# Patient Record
Sex: Female | Born: 1950
Health system: Southern US, Community
[De-identification: ages and names within clinical notes are randomized; demographics above are authoritative.]

## PROBLEM LIST (undated history)

## (undated) DIAGNOSIS — I10 Essential (primary) hypertension: Secondary | ICD-10-CM

## (undated) DIAGNOSIS — M1990 Unspecified osteoarthritis, unspecified site: Secondary | ICD-10-CM

## (undated) DIAGNOSIS — K2270 Barrett's esophagus without dysplasia: Secondary | ICD-10-CM

## (undated) HISTORY — DX: Barrett's esophagus without dysplasia: K22.70

## (undated) HISTORY — PX: HAND SURGERY: SHX662

## (undated) HISTORY — PX: FOOT SURGERY: SHX648

## (undated) HISTORY — DX: Unspecified osteoarthritis, unspecified site: M19.90

## (undated) HISTORY — DX: Essential (primary) hypertension: I10

## (undated) HISTORY — PX: ABDOMINAL HYSTERECTOMY: SHX81

---

## 2004-07-11 ENCOUNTER — Ambulatory Visit: Payer: Self-pay | Admitting: Family Medicine

## 2004-08-29 ENCOUNTER — Other Ambulatory Visit: Payer: Self-pay

## 2004-09-06 ENCOUNTER — Ambulatory Visit: Payer: Self-pay | Admitting: Specialist

## 2004-10-09 ENCOUNTER — Encounter: Payer: Self-pay | Admitting: Specialist

## 2004-10-25 ENCOUNTER — Encounter: Payer: Self-pay | Admitting: Specialist

## 2004-11-22 ENCOUNTER — Encounter: Payer: Self-pay | Admitting: Specialist

## 2005-10-08 ENCOUNTER — Ambulatory Visit: Payer: Self-pay | Admitting: Family Medicine

## 2006-09-17 ENCOUNTER — Emergency Department: Payer: Self-pay | Admitting: Internal Medicine

## 2006-11-08 ENCOUNTER — Ambulatory Visit: Payer: Self-pay | Admitting: Family Medicine

## 2007-11-11 ENCOUNTER — Ambulatory Visit: Payer: Self-pay | Admitting: Family Medicine

## 2007-11-23 ENCOUNTER — Ambulatory Visit: Payer: Self-pay | Admitting: Oncology

## 2007-12-08 ENCOUNTER — Ambulatory Visit: Payer: Self-pay | Admitting: Oncology

## 2007-12-24 ENCOUNTER — Ambulatory Visit: Payer: Self-pay | Admitting: Oncology

## 2008-11-17 ENCOUNTER — Ambulatory Visit: Payer: Self-pay | Admitting: Family Medicine

## 2009-06-22 ENCOUNTER — Ambulatory Visit: Payer: Self-pay | Admitting: Gastroenterology

## 2009-11-22 ENCOUNTER — Ambulatory Visit: Payer: Self-pay | Admitting: Family Medicine

## 2010-11-24 ENCOUNTER — Ambulatory Visit: Payer: Self-pay | Admitting: Family Medicine

## 2011-12-17 ENCOUNTER — Ambulatory Visit: Payer: Self-pay | Admitting: Family Medicine

## 2012-04-22 DIAGNOSIS — M549 Dorsalgia, unspecified: Secondary | ICD-10-CM | POA: Insufficient documentation

## 2012-04-22 DIAGNOSIS — I10 Essential (primary) hypertension: Secondary | ICD-10-CM | POA: Insufficient documentation

## 2012-04-22 DIAGNOSIS — E785 Hyperlipidemia, unspecified: Secondary | ICD-10-CM | POA: Insufficient documentation

## 2012-04-22 DIAGNOSIS — K635 Polyp of colon: Secondary | ICD-10-CM | POA: Insufficient documentation

## 2012-04-22 DIAGNOSIS — Z803 Family history of malignant neoplasm of breast: Secondary | ICD-10-CM | POA: Insufficient documentation

## 2012-10-23 DIAGNOSIS — Z79891 Long term (current) use of opiate analgesic: Secondary | ICD-10-CM | POA: Insufficient documentation

## 2012-10-23 DIAGNOSIS — Z79899 Other long term (current) drug therapy: Secondary | ICD-10-CM | POA: Insufficient documentation

## 2012-12-17 ENCOUNTER — Ambulatory Visit: Payer: Self-pay | Admitting: Family Medicine

## 2013-04-13 DIAGNOSIS — M545 Low back pain, unspecified: Secondary | ICD-10-CM | POA: Insufficient documentation

## 2013-04-13 DIAGNOSIS — M25559 Pain in unspecified hip: Secondary | ICD-10-CM | POA: Insufficient documentation

## 2013-04-13 DIAGNOSIS — M25551 Pain in right hip: Secondary | ICD-10-CM | POA: Insufficient documentation

## 2014-01-21 ENCOUNTER — Ambulatory Visit: Payer: Self-pay | Admitting: Family Medicine

## 2014-03-11 ENCOUNTER — Ambulatory Visit: Payer: Self-pay | Admitting: Gastroenterology

## 2014-07-19 ENCOUNTER — Ambulatory Visit: Payer: Self-pay | Admitting: Podiatry

## 2014-07-21 ENCOUNTER — Encounter: Payer: Self-pay | Admitting: *Deleted

## 2014-07-21 ENCOUNTER — Other Ambulatory Visit: Payer: Self-pay | Admitting: *Deleted

## 2014-07-21 ENCOUNTER — Ambulatory Visit (INDEPENDENT_AMBULATORY_CARE_PROVIDER_SITE_OTHER): Payer: Federal, State, Local not specified - PPO | Admitting: Podiatry

## 2014-07-21 ENCOUNTER — Encounter: Payer: Self-pay | Admitting: Podiatry

## 2014-07-21 ENCOUNTER — Ambulatory Visit (INDEPENDENT_AMBULATORY_CARE_PROVIDER_SITE_OTHER): Payer: Federal, State, Local not specified - PPO

## 2014-07-21 VITALS — BP 110/77 | HR 70 | Resp 16 | Ht 66.0 in | Wt 225.0 lb

## 2014-07-21 DIAGNOSIS — S90121A Contusion of right lesser toe(s) without damage to nail, initial encounter: Secondary | ICD-10-CM

## 2014-07-21 DIAGNOSIS — S93602A Unspecified sprain of left foot, initial encounter: Secondary | ICD-10-CM

## 2014-07-21 DIAGNOSIS — S92912A Unspecified fracture of left toe(s), initial encounter for closed fracture: Secondary | ICD-10-CM

## 2014-07-21 NOTE — Progress Notes (Signed)
She presents today stating that she has recently fallen off her porch twisting her left foot and ankle and injuring her right toe as she points the second digit of the right foot. She denies any changes in her past medical history medications allergies social history family history.  Objective: Vital signs are stable she is alert and oriented 3. Pulses are palpable bilateral. Swollen with ecchymosis overlying the dorsal lateral aspect of the fifth metatarsal left. Right digit second is swollen and mildly erythematous. Radiographic evaluation demonstrates complete transverse fracture of the fifth metatarsal of the left foot. This is consistent with mid diaphyseal distal comminuted fracture. A nondisplaced lateral fracture of the proximal phalanx second digit of the right foot is present.  Assessment: Fracture fifth metatarsal left. Fracture proximal phalanx second digit right foot.  Plan: At this point due to its displacement and the comminution I feel it is important to perform an open reduction with internal fixation requiring screws and plates to the fifth metatarsal left foot. I answered all of her questions regarding a consent form today stating that an open reduction and internal fixation will be performed to the fifth metatarsal left foot. I answered those questions to the best of my ability in layman's terms. She understood it was amenable to it and signed all 3 pages of consent form we discussed the pros and cons of the surgery and the fact that if surgery were not performed that she could ultimately have a chronically painful foot. She also understands that there are possible postop complications which may include but are not limited to postoperative pain bleeding swelling infection recurrence need for further surgery overcorrection and under correction and failure to heal. She also understands that she will need a below-the-knee cast for her postop period of 8 weeks. We'll follow up with her  Friday.

## 2014-07-22 ENCOUNTER — Other Ambulatory Visit: Payer: Self-pay | Admitting: Podiatry

## 2014-07-22 MED ORDER — CLINDAMYCIN HCL 150 MG PO CAPS: 150.0000 mg | ORAL_CAPSULE | Freq: Three times a day (TID) | ORAL | Status: DC

## 2014-07-22 MED ORDER — PROMETHAZINE HCL 25 MG PO TABS: 25.0000 mg | ORAL_TABLET | Freq: Three times a day (TID) | ORAL | Status: DC | PRN

## 2014-07-22 MED ORDER — OXYCODONE-ACETAMINOPHEN 10-325 MG PO TABS: 1.0000 | ORAL_TABLET | Freq: Four times a day (QID) | ORAL | Status: DC | PRN

## 2014-07-23 ENCOUNTER — Encounter: Payer: Self-pay | Admitting: Podiatry

## 2014-07-23 DIAGNOSIS — S92302D Fracture of unspecified metatarsal bone(s), left foot, subsequent encounter for fracture with routine healing: Secondary | ICD-10-CM

## 2014-07-23 NOTE — Progress Notes (Signed)
1. Open reduction internal fixation 5th left  2. Cast app

## 2014-07-24 ENCOUNTER — Telehealth: Payer: Self-pay

## 2014-07-24 NOTE — Telephone Encounter (Signed)
  Left message for patient to call with questions or concerns regarding post operative status 

## 2014-07-27 ENCOUNTER — Other Ambulatory Visit: Payer: Self-pay | Admitting: Podiatry

## 2014-07-27 ENCOUNTER — Telehealth: Payer: Self-pay | Admitting: *Deleted

## 2014-07-27 MED ORDER — HYDROMORPHONE HCL 2 MG PO TABS: 2.0000 mg | ORAL_TABLET | Freq: Four times a day (QID) | ORAL | Status: DC | PRN

## 2014-07-27 NOTE — Telephone Encounter (Signed)
If it is only itching and there are no other allergy symptoms, see if she can take benadryl. She probably has phenergan as well which can help with the itching. If she wants a different medication, she can try vicodin. Although the same class of medication, may not cause itching.

## 2014-07-27 NOTE — Telephone Encounter (Signed)
Having a reaction to one of the medications, its making her itch. Took percocet before bed last night and had itching , took antibiotic this morning and not itching. Can something else be prescribed?

## 2014-07-27 NOTE — Telephone Encounter (Signed)
Can you do this

## 2014-07-28 NOTE — Telephone Encounter (Signed)
May write her for vicodin.  First see if she is doing better.  Does she still need the medication for pain? Might just want to consider anti-inflammatories.

## 2014-08-01 NOTE — Progress Notes (Signed)
Dr Milinda Pointer performed an ORIF 5th met left foot with plate and screw fixation and cast application on 79/49/97

## 2014-08-02 ENCOUNTER — Ambulatory Visit (INDEPENDENT_AMBULATORY_CARE_PROVIDER_SITE_OTHER): Payer: Federal, State, Local not specified - PPO

## 2014-08-02 ENCOUNTER — Ambulatory Visit (INDEPENDENT_AMBULATORY_CARE_PROVIDER_SITE_OTHER): Payer: Federal, State, Local not specified - PPO | Admitting: Podiatry

## 2014-08-02 VITALS — BP 142/69 | HR 78 | Temp 97.9°F | Resp 16

## 2014-08-02 DIAGNOSIS — Z9889 Other specified postprocedural states: Secondary | ICD-10-CM

## 2014-08-02 DIAGNOSIS — S92302D Fracture of unspecified metatarsal bone(s), left foot, subsequent encounter for fracture with routine healing: Secondary | ICD-10-CM

## 2014-08-02 NOTE — Progress Notes (Signed)
She presents today a little more than 1 week status post open reduction internal fixation fifth metatarsal left foot. She denies fever chills nausea vomiting muscle aches and pains.  Objective: Vital signs are stable she is alert and oriented 3. Cast is intact but the bottom of the cast is dirty. Cast is loose around the calf but maintains integrity around the foot. Radiographic evaluation demonstrates fifth metatarsal in good alignment.  Assessment: Well-healing surgical foot left.  Plan: Remove cast next visit. Recast next visit remove staples next visit. Remain nonweightbearing.

## 2014-08-09 ENCOUNTER — Ambulatory Visit (INDEPENDENT_AMBULATORY_CARE_PROVIDER_SITE_OTHER): Payer: Federal, State, Local not specified - PPO | Admitting: Podiatry

## 2014-08-09 VITALS — BP 135/69 | HR 62 | Temp 97.7°F | Resp 16

## 2014-08-09 DIAGNOSIS — S92302D Fracture of unspecified metatarsal bone(s), left foot, subsequent encounter for fracture with routine healing: Secondary | ICD-10-CM

## 2014-08-09 DIAGNOSIS — Z9889 Other specified postprocedural states: Secondary | ICD-10-CM

## 2014-08-09 MED ORDER — OXYCODONE-ACETAMINOPHEN 10-325 MG PO TABS: 1.0000 | ORAL_TABLET | Freq: Four times a day (QID) | ORAL | Status: DC | PRN

## 2014-08-09 NOTE — Progress Notes (Signed)
2 weeks status post open reduction internal fixation left foot. Denies fever chills nausea vomiting muscle aches and pains. Continues to remain nonweightbearing.  Objective: Vital signs are stable alert and oriented 3 cast intact was removed reveals margins are well coapted staples are intact. Removed approximately half of the staples today margins remain well coapted. That she does have considerable amount of brawny edema left foot.  Assessment: Two-week status post open reduction internal fixation left foot.  Plan: Remove cast left foot and leg today. She will remain nonweightbearing with her Cam Walker.

## 2014-08-25 ENCOUNTER — Ambulatory Visit (INDEPENDENT_AMBULATORY_CARE_PROVIDER_SITE_OTHER): Payer: Federal, State, Local not specified - PPO

## 2014-08-25 ENCOUNTER — Ambulatory Visit (INDEPENDENT_AMBULATORY_CARE_PROVIDER_SITE_OTHER): Payer: Federal, State, Local not specified - PPO | Admitting: Podiatry

## 2014-08-25 VITALS — BP 112/70 | HR 66 | Resp 16

## 2014-08-25 DIAGNOSIS — Z9889 Other specified postprocedural states: Secondary | ICD-10-CM

## 2014-08-25 DIAGNOSIS — S92302D Fracture of unspecified metatarsal bone(s), left foot, subsequent encounter for fracture with routine healing: Secondary | ICD-10-CM

## 2014-08-26 NOTE — Progress Notes (Signed)
She presents today utilizing crutches for follow-up of her fifth metatarsal open reduction internal fixation date of surgery was 07/23/2014. She states this seems to be doing pretty well.  Objective: Presenting with crutches and a partial weight-bearing stance utilizing her cam walker. Once removed reveals a superficial dehiscence only measuring approximately a centimeter in length and 4 mm in width with fibrous deposition. No signs of infection the sutures that were remaining were removed and the margins remained well coapted in that area. I resected the fibrous deposition today and encouraged bleeding of the granular tissue. I saw no signs of infection. No erythema no cellulitis drainage or odor. Radiographic evaluation demonstrates well-healing fifth metatarsal with open reduction internal fixation utilizing a plate and 6 screws.  Assessment: Well-healing fifth metatarsal fracture right.  Plan: She will continue to utilize her Cam Walker. She will perform wet-to-dry dressing changes twice daily to the wound. She does not have to utilize her crutches. She was given strict instructions as not to walk on this barefoot.

## 2014-09-06 ENCOUNTER — Ambulatory Visit (INDEPENDENT_AMBULATORY_CARE_PROVIDER_SITE_OTHER): Payer: Federal, State, Local not specified - PPO

## 2014-09-06 ENCOUNTER — Other Ambulatory Visit: Payer: Self-pay | Admitting: Podiatry

## 2014-09-06 ENCOUNTER — Ambulatory Visit (INDEPENDENT_AMBULATORY_CARE_PROVIDER_SITE_OTHER): Payer: Federal, State, Local not specified - PPO | Admitting: Podiatry

## 2014-09-06 ENCOUNTER — Encounter: Payer: Self-pay | Admitting: Podiatry

## 2014-09-06 VITALS — BP 136/69 | HR 79 | Resp 16

## 2014-09-06 DIAGNOSIS — S92301D Fracture of unspecified metatarsal bone(s), right foot, subsequent encounter for fracture with routine healing: Secondary | ICD-10-CM

## 2014-09-06 DIAGNOSIS — S92302D Fracture of unspecified metatarsal bone(s), left foot, subsequent encounter for fracture with routine healing: Secondary | ICD-10-CM

## 2014-09-06 DIAGNOSIS — M79673 Pain in unspecified foot: Secondary | ICD-10-CM

## 2014-09-06 NOTE — Progress Notes (Signed)
She presents today for follow-up of open reduction internal fixation fifth metatarsal left foot. She states that it seems to be doing pretty well. She presents today in her Darco shoe.  Objective: Vital signs are stable she is alert and oriented 3 date of surgery was 07/23/2014 open reduction internal fixation 6-hole plate fifth metatarsal left foot. Radiographically appears to be healing well physical exam demonstrate mild edema overlying the fifth met.  Assessment well-healing surgical foot left.  Plan:

## 2014-10-04 ENCOUNTER — Ambulatory Visit (INDEPENDENT_AMBULATORY_CARE_PROVIDER_SITE_OTHER): Payer: Federal, State, Local not specified - PPO

## 2014-10-04 ENCOUNTER — Ambulatory Visit (INDEPENDENT_AMBULATORY_CARE_PROVIDER_SITE_OTHER): Payer: Federal, State, Local not specified - PPO | Admitting: Podiatry

## 2014-10-04 VITALS — BP 140/70 | HR 84 | Resp 16

## 2014-10-04 DIAGNOSIS — S92302D Fracture of unspecified metatarsal bone(s), left foot, subsequent encounter for fracture with routine healing: Secondary | ICD-10-CM

## 2014-10-04 NOTE — Progress Notes (Signed)
She presents today for follow-up of open reduction internal fixation fifth metatarsal left foot. She states it seems to be doing pretty well.  Objective: Vital signs are stable she is alert and oriented 3. Pulses are palpable. No pain on palpation fifth metatarsal base left foot. No edema. Radiographic evaluation demonstrates well-healed fifth metatarsal fracture fifth left.  Assessment: Well-healing surgical foot left.  Plan: Follow up with me as needed.

## 2014-10-27 ENCOUNTER — Ambulatory Visit: Payer: Self-pay | Admitting: Family Medicine

## 2014-12-16 ENCOUNTER — Emergency Department: Payer: Self-pay | Admitting: Emergency Medicine

## 2015-01-07 ENCOUNTER — Other Ambulatory Visit: Payer: Self-pay | Admitting: Family Medicine

## 2015-01-07 DIAGNOSIS — M479 Spondylosis, unspecified: Secondary | ICD-10-CM

## 2015-01-07 DIAGNOSIS — Z1231 Encounter for screening mammogram for malignant neoplasm of breast: Secondary | ICD-10-CM

## 2015-01-24 ENCOUNTER — Ambulatory Visit
Admission: RE | Admit: 2015-01-24 | Discharge: 2015-01-24 | Disposition: A | Discharge status: Home / Self Care | Payer: Federal, State, Local not specified - PPO | Source: Ambulatory Visit | Attending: Family Medicine | Admitting: Family Medicine

## 2015-01-24 DIAGNOSIS — Z1231 Encounter for screening mammogram for malignant neoplasm of breast: Secondary | ICD-10-CM | POA: Diagnosis present

## 2015-02-11 ENCOUNTER — Telehealth: Payer: Self-pay | Admitting: *Deleted

## 2015-02-11 NOTE — Telephone Encounter (Signed)
Received a fax requesting medical necessity for DME. Faxed chart notes to [facility] [place] at [phone]

## 2015-03-10 ENCOUNTER — Encounter: Payer: Self-pay | Admitting: *Deleted

## 2015-04-12 ENCOUNTER — Encounter: Payer: Self-pay | Admitting: Family Medicine

## 2015-04-12 ENCOUNTER — Ambulatory Visit (INDEPENDENT_AMBULATORY_CARE_PROVIDER_SITE_OTHER): Payer: Federal, State, Local not specified - PPO | Admitting: Family Medicine

## 2015-04-12 VITALS — BP 130/70 | HR 64 | Temp 98.0°F | Resp 16 | Ht 63.0 in | Wt 219.4 lb

## 2015-04-12 DIAGNOSIS — E785 Hyperlipidemia, unspecified: Secondary | ICD-10-CM | POA: Diagnosis not present

## 2015-04-12 DIAGNOSIS — I10 Essential (primary) hypertension: Secondary | ICD-10-CM

## 2015-04-12 DIAGNOSIS — K589 Irritable bowel syndrome without diarrhea: Secondary | ICD-10-CM

## 2015-04-12 DIAGNOSIS — K2970 Gastritis, unspecified, without bleeding: Secondary | ICD-10-CM

## 2015-04-12 NOTE — Progress Notes (Signed)
Name: Monica Chapman   MRN: [id number]    DOB: 1950/12/12   Date:04/12/2015       Progress Note  Subjective  Chief Complaint  Chief Complaint  Patient presents with  . Hypertension    Hypertension Pertinent negatives include no blurred vision, chest pain, headaches, malaise/fatigue, orthopnea, palpitations or shortness of breath.    Here for f/u of HBP.  C/o GI sx of bloating, frequent BMs, loose stools.  No other problems other than that.   Taking Wellbutrin to help stop smoking. But she is still smoking daily, but at a reduced amount.   Past Medical History  Diagnosis Date  . Arthritis   . Hypertension     Past Surgical History  Procedure Laterality Date  . Foot surgery    . Abdominal hysterectomy      Family History  Problem Relation Age of Onset  . Breast cancer Mother 60  . Breast cancer Maternal Grandmother     History   Social History  . Marital Status: Married    Spouse Name: N/A  . Number of Children: N/A  . Years of Education: N/A   Occupational History  . Not on file.   Social History Main Topics  . Smoking status: Current Every Day Smoker -- 0.25 packs/day    Types: Cigarettes  . Smokeless tobacco: Never Used  . Alcohol Use: No  . Drug Use: No  . Sexual Activity: Not on file   Other Topics Concern  . Not on file   Social History Narrative     Current outpatient prescriptions:  .  alendronate (FOSAMAX) 70 MG tablet, , Disp: , Rfl: 1 .  atorvastatin (LIPITOR) 20 MG tablet, 10 mg. , Disp: , Rfl:  .  buPROPion (WELLBUTRIN XL) 150 MG 24 hr tablet, , Disp: , Rfl: 0 .  calcium-vitamin D (OSCAL WITH D) 500-200 MG-UNIT per tablet, Take 1 tablet by mouth., Disp: , Rfl:  .  Cetirizine HCl 10 MG CAPS, Take by mouth., Disp: , Rfl:  .  hydrochlorothiazide (HYDRODIURIL) 12.5 MG tablet, Take 12.5 mg by mouth daily., Disp: , Rfl:  .  losartan (COZAAR) 100 MG tablet, Take 100 mg by mouth daily., Disp: , Rfl: 0 .  Melatonin 3 MG TABS, Take by mouth as  needed., Disp: , Rfl:  .  omeprazole (PRILOSEC) 20 MG capsule, , Disp: , Rfl: 0 .  oxyCODONE-acetaminophen (PERCOCET) 10-325 MG per tablet, Take 1 tablet by mouth every 6 (six) hours as needed for pain., Disp: 40 tablet, Rfl: 0  Allergies  Allergen Reactions  . Penicillins Itching    At about 64 yo with itching not serious. Did not require medical care     Review of Systems  Constitutional: Negative.  Negative for fever, chills, weight loss and malaise/fatigue.  HENT: Negative.   Eyes: Negative.  Negative for blurred vision and double vision.  Respiratory: Negative.  Negative for cough, sputum production, shortness of breath and wheezing.   Cardiovascular: Negative.  Negative for chest pain, palpitations, orthopnea and leg swelling.  Gastrointestinal: Positive for heartburn, abdominal pain and diarrhea. Negative for nausea, vomiting and blood in stool.  Genitourinary: Negative.  Negative for dysuria, urgency and frequency.  Musculoskeletal: Positive for back pain. Negative for myalgias and joint pain.  Skin: Negative.  Negative for rash.  Neurological: Negative for dizziness, tingling, weakness and headaches.  Psychiatric/Behavioral: Positive for depression. The patient is not nervous/anxious.       Objective  Filed Vitals:  04/12/15 0945  BP: 110/64  Pulse: 64  Temp: 98 F (36.7 C)  Resp: 16  Height: 5\' 3"  (1.6 m)  Weight: 219 lb 6.4 oz (99.519 kg)    Physical Exam  Constitutional: She is well-developed, well-nourished, and in no distress.  HENT:  Head: Normocephalic and atraumatic.  Eyes: Conjunctivae and EOM are normal. Pupils are equal, round, and reactive to light. No scleral icterus.  Neck: Normal range of motion. Neck supple. No thyromegaly present.  Cardiovascular: Normal rate, regular rhythm and intact distal pulses.  Exam reveals no gallop and no friction rub.   Murmur heard.  Systolic murmur is present with a grade of 2/6  URSB  Pulmonary/Chest: Effort  normal and breath sounds normal. No respiratory distress. She has no wheezes. She has no rales.  Abdominal: Soft. Bowel sounds are normal. She exhibits mass. There is tenderness (minimal tenderness in epigastrium).  Musculoskeletal: She exhibits edema (trace bilateral pedal edema.).  Lymphadenopathy:    She has no cervical adenopathy.  Psychiatric: Mood, memory, affect and judgment normal.  Vitals reviewed.         Assessment & Plan  Problem List Items Addressed This Visit    None      Meds ordered this encounter  Medications  . alendronate (FOSAMAX) 70 MG tablet    Sig:     Refill:  1  . buPROPion (WELLBUTRIN XL) 150 MG 24 hr tablet    Sig:     Refill:  0  . losartan (COZAAR) 100 MG tablet    Sig: Take 100 mg by mouth daily.    Refill:  0  . omeprazole (PRILOSEC) 20 MG capsule    Sig:     Refill:  0  . calcium-vitamin D (OSCAL WITH D) 500-200 MG-UNIT per tablet    Sig: Take 1 tablet by mouth.  . hydrochlorothiazide (HYDRODIURIL) 12.5 MG tablet    Sig: Take 12.5 mg by mouth daily.  . Cetirizine HCl 10 MG CAPS    Sig: Take by mouth.  . Melatonin 3 MG TABS    Sig: Take by mouth as needed.    1. Essential hypertension -continue meds.  2. HLD (hyperlipidemia) -continue meds  3. Gastritis -continue meds  4. IBS (irritable bowel syndrome)

## 2015-04-12 NOTE — Patient Instructions (Signed)
Increase water and fiber (Metamucil, Fibercon, etc) in diet.

## 2015-05-18 ENCOUNTER — Other Ambulatory Visit: Payer: Self-pay | Admitting: Family Medicine

## 2015-05-18 NOTE — Telephone Encounter (Signed)
Pt need a refill on   Hydrochlorothiazide  125 mg

## 2015-05-18 NOTE — Telephone Encounter (Signed)
Called pharmacy and patient has refill on file.

## 2015-07-14 ENCOUNTER — Ambulatory Visit (INDEPENDENT_AMBULATORY_CARE_PROVIDER_SITE_OTHER): Payer: Federal, State, Local not specified - PPO | Admitting: Family Medicine

## 2015-07-14 ENCOUNTER — Encounter: Payer: Self-pay | Admitting: Family Medicine

## 2015-07-14 VITALS — BP 119/71 | HR 61 | Temp 98.3°F | Resp 16 | Ht 63.0 in | Wt 222.2 lb

## 2015-07-14 DIAGNOSIS — G5691 Unspecified mononeuropathy of right upper limb: Secondary | ICD-10-CM

## 2015-07-14 DIAGNOSIS — I10 Essential (primary) hypertension: Secondary | ICD-10-CM

## 2015-07-14 DIAGNOSIS — K2970 Gastritis, unspecified, without bleeding: Secondary | ICD-10-CM

## 2015-07-14 DIAGNOSIS — G5690 Unspecified mononeuropathy of unspecified upper limb: Secondary | ICD-10-CM | POA: Insufficient documentation

## 2015-07-14 NOTE — Progress Notes (Signed)
Name: Monica Chapman   MRN: [id number]    DOB: 30-Dec-1950   Date:07/14/2015       Progress Note  Subjective  Chief Complaint  Chief Complaint  Patient presents with  . Abdominal Pain    follow up for may be stomach ulcer improved after medication  . Numbness    R hand onset 3-4 days    HPI Patient states that epigastric pain has resolved with taking Omeprazole. Doing well.  Taking all BP meds as well as chol. meds and Alendronate.  C/o dorsal R hand with "numbness"  For past 3-4 days.  No trauma remembered.  Motor skills all intact.  No specific repeatative hand motion  No problem-specific assessment & plan notes found for this encounter.   Past Medical History  Diagnosis Date  . Arthritis   . Hypertension     Social History  Substance Use Topics  . Smoking status: Current Every Day Smoker -- 0.25 packs/day    Types: Cigarettes  . Smokeless tobacco: Never Used  . Alcohol Use: 0.0 oz/week    0 Standard drinks or equivalent per week     Current outpatient prescriptions:  .  alendronate (FOSAMAX) 70 MG tablet, , Disp: , Rfl: 1 .  atorvastatin (LIPITOR) 20 MG tablet, 10 mg. , Disp: , Rfl:  .  buPROPion (WELLBUTRIN XL) 150 MG 24 hr tablet, , Disp: , Rfl: 0 .  calcium-vitamin D (OSCAL WITH D) 500-200 MG-UNIT per tablet, Take 1 tablet by mouth., Disp: , Rfl:  .  Cetirizine HCl 10 MG CAPS, Take by mouth., Disp: , Rfl:  .  hydrochlorothiazide (HYDRODIURIL) 12.5 MG tablet, Take 12.5 mg by mouth daily., Disp: , Rfl:  .  losartan (COZAAR) 100 MG tablet, Take 100 mg by mouth daily., Disp: , Rfl: 0 .  Melatonin 3 MG TABS, Take by mouth as needed., Disp: , Rfl:  .  omeprazole (PRILOSEC) 20 MG capsule, , Disp: , Rfl: 0  Allergies  Allergen Reactions  . Penicillins Itching    At about 64 yo with itching not serious. Did not require medical care    Review of Systems  Constitutional: Negative for fever, chills, weight loss and malaise/fatigue.  HENT: Positive for congestion  (improving). Negative for hearing loss.   Eyes: Negative for blurred vision and double vision.  Respiratory: Negative for cough, shortness of breath and wheezing.   Cardiovascular: Negative for chest pain, palpitations and leg swelling.  Gastrointestinal: Negative for heartburn, abdominal pain and blood in stool.  Genitourinary: Negative for dysuria, urgency and frequency.  Skin: Negative for rash.  Neurological: Positive for tingling (R dorsal hand.). Negative for weakness and headaches.      Objective  Filed Vitals:   07/14/15 1006  BP: 119/71  Pulse: 61  Temp: 98.3 F (36.8 C)  TempSrc: Oral  Resp: 16  Height: 5\' 3"  (1.6 m)  Weight: 222 lb 3.2 oz (100.789 kg)     Physical Exam  Constitutional: She is oriented to person, place, and time and well-developed, well-nourished, and in no distress. No distress.  HENT:  Head: Normocephalic and atraumatic.  Eyes: Conjunctivae and EOM are normal. Pupils are equal, round, and reactive to light. No scleral icterus.  Neck: Normal range of motion. Neck supple. No thyromegaly present.  Cardiovascular: Normal rate, regular rhythm, normal heart sounds and intact distal pulses.  Exam reveals no gallop and no friction rub.   No murmur heard. Pulmonary/Chest: Effort normal and breath sounds normal. No respiratory distress.  She has no wheezes. She has no rales.  Abdominal: Soft. Bowel sounds are normal. She exhibits no distension and no mass. There is no tenderness.  Musculoskeletal: She exhibits edema (trace bilateral pedal edema.).  Lymphadenopathy:    She has no cervical adenopathy.  Neurological: She is alert and oriented to person, place, and time.  Report of mld "tingling sensation" on dorsal R hand.  No neurologic deficits noted.  No worsen ing with neck, should, elbow or wrist movement.  No tenderness to palp of dorsal hand.  Vitals reviewed.     No results found for this or any previous visit (from the past 2160  hour(s)).   Assessment & Plan  1. Gastritis -cont. Omeprazole  2. Essential hypertension -cont. meds  3. Neuropathy of hand, right -rest.  Reassure.  Should resolve spontaneously.

## 2015-07-14 NOTE — Patient Instructions (Addendum)
Rest R hand.  Continue to avoid NSAIDS.  Cont. Her current meds.  Wants to get flu shot at pharmacy in 1-2 weeks.

## 2015-07-20 ENCOUNTER — Emergency Department: Admission: EM | Admit: 2015-07-20 | Payer: Federal, State, Local not specified - PPO

## 2015-07-25 ENCOUNTER — Other Ambulatory Visit: Payer: Self-pay | Admitting: Family Medicine

## 2015-07-25 MED ORDER — ATORVASTATIN CALCIUM 20 MG PO TABS: 10.0000 mg | ORAL_TABLET | Freq: Every day | ORAL | Status: DC

## 2015-11-15 ENCOUNTER — Ambulatory Visit (INDEPENDENT_AMBULATORY_CARE_PROVIDER_SITE_OTHER): Payer: Federal, State, Local not specified - PPO | Admitting: Family Medicine

## 2015-11-15 ENCOUNTER — Encounter: Payer: Self-pay | Admitting: Family Medicine

## 2015-11-15 VITALS — BP 117/67 | HR 77 | Resp 16 | Ht 66.0 in | Wt 215.0 lb

## 2015-11-15 DIAGNOSIS — K2970 Gastritis, unspecified, without bleeding: Secondary | ICD-10-CM

## 2015-11-15 DIAGNOSIS — E785 Hyperlipidemia, unspecified: Secondary | ICD-10-CM | POA: Diagnosis not present

## 2015-11-15 DIAGNOSIS — I10 Essential (primary) hypertension: Secondary | ICD-10-CM

## 2015-11-15 DIAGNOSIS — F419 Anxiety disorder, unspecified: Secondary | ICD-10-CM

## 2015-11-15 DIAGNOSIS — K589 Irritable bowel syndrome without diarrhea: Secondary | ICD-10-CM | POA: Diagnosis not present

## 2015-11-15 MED ORDER — BUPROPION HCL ER (XL) 150 MG PO TB24: 150.0000 mg | ORAL_TABLET | Freq: Every day | ORAL | Status: DC

## 2015-11-15 MED ORDER — OMEPRAZOLE 20 MG PO CPDR: 20.0000 mg | DELAYED_RELEASE_CAPSULE | Freq: Every day | ORAL | Status: DC

## 2015-11-15 NOTE — Progress Notes (Signed)
Name: Monica Chapman   MRN: [id number]    DOB: 1951/05/22   Date:11/15/2015       Progress Note  Subjective  Chief Complaint  Chief Complaint  Patient presents with  . Hypertension    HPI  Here for f/u of HBP.  Also has anxiety issues that contribute to smoking.  This is improved with Wellbutrin.  No cigs x 10 days.  Stomach doing well. No problem-specific assessment & plan notes found for this encounter.   Past Medical History  Diagnosis Date  . Arthritis   . Hypertension     Past Surgical History  Procedure Laterality Date  . Foot surgery    . Abdominal hysterectomy      Family History  Problem Relation Age of Onset  . Breast cancer Mother 7  . Breast cancer Maternal Grandmother     Social History   Social History  . Marital Status: Married    Spouse Name: N/A  . Number of Children: N/A  . Years of Education: N/A   Occupational History  . Not on file.   Social History Main Topics  . Smoking status: Former Smoker -- 0.25 packs/day    Types: Cigarettes    Quit date: 10/28/2015  . Smokeless tobacco: Never Used  . Alcohol Use: 0.0 oz/week    0 Standard drinks or equivalent per week  . Drug Use: No  . Sexual Activity: Not on file   Other Topics Concern  . Not on file   Social History Narrative     Current outpatient prescriptions:  .  alendronate (FOSAMAX) 70 MG tablet, , Disp: , Rfl: 1 .  atorvastatin (LIPITOR) 20 MG tablet, Take 0.5 tablets (10 mg total) by mouth daily at 6 PM., Disp: 30 tablet, Rfl: 2 .  buPROPion (WELLBUTRIN XL) 150 MG 24 hr tablet, Take 1 tablet (150 mg total) by mouth daily., Disp: 30 tablet, Rfl: 3 .  calcium-vitamin D (OSCAL WITH D) 500-200 MG-UNIT per tablet, Take 1 tablet by mouth., Disp: , Rfl:  .  Cetirizine HCl 10 MG CAPS, Take by mouth., Disp: , Rfl:  .  hydrochlorothiazide (HYDRODIURIL) 12.5 MG tablet, Take 12.5 mg by mouth daily., Disp: , Rfl:  .  losartan (COZAAR) 100 MG tablet, Take 100 mg by mouth daily., Disp: ,  Rfl: 0 .  Melatonin 3 MG TABS, Take by mouth as needed., Disp: , Rfl:  .  omeprazole (PRILOSEC) 20 MG capsule, Take 1 capsule (20 mg total) by mouth daily., Disp: 30 capsule, Rfl: 12  Allergies  Allergen Reactions  . Penicillins Itching    At about 65 yo with itching not serious. Did not require medical care     Review of Systems  Constitutional: Positive for weight loss (desired). Negative for fever, chills and malaise/fatigue.  HENT: Negative for hearing loss.   Eyes: Negative for blurred vision and double vision.  Respiratory: Negative for cough, shortness of breath and wheezing.   Cardiovascular: Negative for chest pain, palpitations and leg swelling.  Gastrointestinal: Negative for heartburn, abdominal pain and blood in stool.  Genitourinary: Negative for dysuria, urgency and frequency.  Musculoskeletal: Negative for myalgias and joint pain.  Skin: Negative for rash.  Neurological: Negative for dizziness, tremors, weakness and headaches.  Psychiatric/Behavioral: The patient is nervous/anxious.       Objective  Filed Vitals:   11/15/15 1111  BP: 117/67  Pulse: 77  Resp: 16  Height: 5\' 6"  (1.676 m)  Weight: 215 lb (97.523 kg)  Physical Exam  Constitutional: She is well-developed, well-nourished, and in no distress. No distress.  HENT:  Head: Normocephalic and atraumatic.  Eyes: Conjunctivae and EOM are normal. Pupils are equal, round, and reactive to light. No scleral icterus.  Neck: Normal range of motion. Neck supple. Carotid bruit is not present. No thyromegaly present.  Cardiovascular: Normal rate and regular rhythm.  Exam reveals no gallop and no friction rub.   Murmur heard.  Systolic murmur is present with a grade of 2/6  URSB  Pulmonary/Chest: Effort normal and breath sounds normal. No respiratory distress. She has no wheezes. She has no rales.  Abdominal: Soft. Bowel sounds are normal. She exhibits no distension, no abdominal bruit and no mass. There is  no tenderness.  Musculoskeletal: She exhibits no edema.  Lymphadenopathy:    She has no cervical adenopathy.  Neurological: She is alert.  Psychiatric: Affect normal.  Vitals reviewed.      No results found for this or any previous visit (from the past 2160 hour(s)).   Assessment & Plan  Problem List Items Addressed This Visit      Cardiovascular and Mediastinum   BP (high blood pressure) - Primary     Digestive   Gastritis   Relevant Medications   omeprazole (PRILOSEC) 20 MG capsule   IBS (irritable bowel syndrome)   Relevant Medications   omeprazole (PRILOSEC) 20 MG capsule     Other   HLD (hyperlipidemia)   Anxiety disorder   Relevant Medications   buPROPion (WELLBUTRIN XL) 150 MG 24 hr tablet      Meds ordered this encounter  Medications  . DISCONTD: etodolac (LODINE) 500 MG tablet    Sig: Take by mouth.  . DISCONTD: AFLURIA PRESERVATIVE FREE 0.5 ML SUSY    Sig: inject 0.5 milliliter intramuscularly    Refill:  0  . buPROPion (WELLBUTRIN XL) 150 MG 24 hr tablet    Sig: Take 1 tablet (150 mg total) by mouth daily.    Dispense:  30 tablet    Refill:  3  . omeprazole (PRILOSEC) 20 MG capsule    Sig: Take 1 capsule (20 mg total) by mouth daily.    Dispense:  30 capsule    Refill:  12   1. Essential hypertension  Cont. meds 2. Gastritis  - omeprazole (PRILOSEC) 20 MG capsule; Take 1 capsule (20 mg total) by mouth daily.  Dispense: 30 capsule; Refill: 12  3. IBS (irritable bowel syndrome)   4. HLD (hyperlipidemia)  Cont meds 5. Anxiety disorder, unspecified anxiety disorder type  - buPROPion (WELLBUTRIN XL) 150 MG 24 hr tablet; Take 1 tablet (150 mg total) by mouth daily.  Dispense: 30 tablet; Refill: 3

## 2015-12-14 ENCOUNTER — Ambulatory Visit (INDEPENDENT_AMBULATORY_CARE_PROVIDER_SITE_OTHER): Payer: Federal, State, Local not specified - PPO | Admitting: Podiatry

## 2015-12-14 ENCOUNTER — Encounter: Payer: Self-pay | Admitting: Podiatry

## 2015-12-14 VITALS — BP 120/57 | HR 83 | Resp 16

## 2015-12-14 DIAGNOSIS — L600 Ingrowing nail: Secondary | ICD-10-CM

## 2015-12-14 MED ORDER — NEOMYCIN-POLYMYXIN-HC 1 % OT SOLN: OTIC | Status: DC

## 2015-12-14 NOTE — Patient Instructions (Signed)

## 2015-12-14 NOTE — Progress Notes (Signed)
She presents today with several years history of ingrown toenails to the hallux bilateral. She states that is getting to the point where can't do them out anymore than becoming too painful. She would like to have them removed.  Objective: Vital signs are stable she is alert and oriented 3 I have reviewed her past medical history medications allergies surgery social history and review of systems. Pulses remain palpable bilateral and capillary fill time to digits 1 through 5 bilaterally are noted to be immediate. Sharp incurvated nail margins along the tibial and fibular border of the hallux bilaterally with some onychocryptosis noted. No purulence and no malodor.  Assessment: Ingrown nails hallux bilateral.  Plan: At this point we performed a chemical matrixectomy to the tibial and fibular borders of the hallux bilateral. This is performed after local anesthesia was administered. She tolerated this procedure well and was given both oral and written home-going instructions for care and soaking of her toes. She was also given a prescription for Cortisporin Otic which she will apply twice daily after soaking. I will follow-up with her in 1 week to make sure she is doing well. Questions or concerns she will notify us immediately.

## 2015-12-16 ENCOUNTER — Other Ambulatory Visit: Payer: Self-pay | Admitting: Family Medicine

## 2015-12-16 DIAGNOSIS — Z1231 Encounter for screening mammogram for malignant neoplasm of breast: Secondary | ICD-10-CM

## 2015-12-19 ENCOUNTER — Ambulatory Visit
Admission: RE | Admit: 2015-12-19 | Discharge: 2015-12-19 | Disposition: A | Discharge status: Home / Self Care | Payer: Federal, State, Local not specified - PPO | Source: Ambulatory Visit | Attending: Family Medicine | Admitting: Family Medicine

## 2015-12-19 DIAGNOSIS — Z1231 Encounter for screening mammogram for malignant neoplasm of breast: Secondary | ICD-10-CM | POA: Diagnosis present

## 2015-12-26 ENCOUNTER — Encounter: Payer: Self-pay | Admitting: Podiatry

## 2015-12-26 ENCOUNTER — Ambulatory Visit (INDEPENDENT_AMBULATORY_CARE_PROVIDER_SITE_OTHER): Payer: Federal, State, Local not specified - PPO | Admitting: Podiatry

## 2015-12-26 DIAGNOSIS — L600 Ingrowing nail: Secondary | ICD-10-CM

## 2015-12-26 NOTE — Progress Notes (Signed)
She presents today for follow-up of matrixectomy hallux bilateral. She continues to soak twice daily Betadine warm water and apply Cortisporin Otic as directed.  Objective: Vital signs are stable she is alert and oriented 3. Pulses are palpable. Neurologic sensorium is intact per Semmes-Weinstein monofilament. Deep tendon reflexes are intact. Margins to the hallux bilaterally. Healing in there is mild erythema and no cellulitis drainage or odor.  Assessment: Well-healing matrixectomy hallux bilateral.  Plan: Discussed etiology pathology conservative versus surgical therapies. Discontinue Betadine warm water spell with Epsom salts and warm water soaks. Continue soapy to completely resolved. She will cover with a Band-Aid during the day and leave her toes open to air at night.

## 2016-01-09 ENCOUNTER — Telehealth: Payer: Self-pay | Admitting: Family Medicine

## 2016-01-09 MED ORDER — HYDROCHLOROTHIAZIDE 12.5 MG PO TABS: 12.5000 mg | ORAL_TABLET | Freq: Every day | ORAL | Status: DC

## 2016-01-09 NOTE — Telephone Encounter (Signed)
sent 

## 2016-01-09 NOTE — Telephone Encounter (Signed)
Pt needs a refill on hydrochlorothiazide sent to [facility]

## 2016-02-14 ENCOUNTER — Ambulatory Visit (INDEPENDENT_AMBULATORY_CARE_PROVIDER_SITE_OTHER): Payer: Federal, State, Local not specified - PPO | Admitting: Family Medicine

## 2016-02-14 ENCOUNTER — Encounter: Payer: Self-pay | Admitting: Family Medicine

## 2016-02-14 VITALS — BP 111/66 | HR 82 | Temp 97.8°F | Resp 16 | Ht 66.0 in | Wt 219.0 lb

## 2016-02-14 DIAGNOSIS — J40 Bronchitis, not specified as acute or chronic: Secondary | ICD-10-CM

## 2016-02-14 MED ORDER — AZITHROMYCIN 250 MG PO TABS: ORAL_TABLET | ORAL | Status: DC

## 2016-02-14 MED ORDER — DM-GUAIFENESIN ER 30-600 MG PO TB12: 1.0000 | ORAL_TABLET | Freq: Two times a day (BID) | ORAL | Status: DC | PRN

## 2016-02-14 NOTE — Progress Notes (Signed)
Name: Monica Chapman   MRN: [id number]    DOB: 10/06/1950   Date:02/14/2016       Progress Note  Subjective  Chief Complaint  Chief Complaint  Patient presents with  . Hypertension  . URI    x 5 days coughing yellow/green mucus,wheezing, sob slightly.    HPI Here c/o cough and congestion for for 5-6 days.  Cough is occ productive of yellow mucus.  Nasal mucus is yellow also.  ? Fever with beginning of illness.  Sopme aches and chills to start.   None now.  No problem-specific assessment & plan notes found for this encounter.   Past Medical History  Diagnosis Date  . Arthritis   . Hypertension     Social History  Substance Use Topics  . Smoking status: Former Smoker -- 0.25 packs/day    Types: Cigarettes    Quit date: 10/28/2015  . Smokeless tobacco: Never Used  . Alcohol Use: 0.0 oz/week    0 Standard drinks or equivalent per week     Current outpatient prescriptions:  .  alendronate (FOSAMAX) 70 MG tablet, , Disp: , Rfl: 1 .  atorvastatin (LIPITOR) 20 MG tablet, Take 0.5 tablets (10 mg total) by mouth daily at 6 PM., Disp: 30 tablet, Rfl: 2 .  buPROPion (WELLBUTRIN XL) 150 MG 24 hr tablet, Take 1 tablet (150 mg total) by mouth daily., Disp: 30 tablet, Rfl: 3 .  calcium-vitamin D (OSCAL WITH D) 500-200 MG-UNIT per tablet, Take 1 tablet by mouth., Disp: , Rfl:  .  Cetirizine HCl 10 MG CAPS, Take 10 mg by mouth at bedtime. , Disp: , Rfl:  .  hydrochlorothiazide (HYDRODIURIL) 12.5 MG tablet, Take 1 tablet (12.5 mg total) by mouth daily., Disp: 30 tablet, Rfl: 3 .  losartan (COZAAR) 100 MG tablet, Take 100 mg by mouth daily., Disp: , Rfl: 0 .  Melatonin 3 MG TABS, Take by mouth as needed., Disp: , Rfl:  .  NEOMYCIN-POLYMYXIN-HYDROCORTISONE (CORTISPORIN) 1 % SOLN otic solution, Apply 1-2 drops to toe BID after soaking, Disp: 10 mL, Rfl: 1 .  omeprazole (PRILOSEC) 20 MG capsule, Take 1 capsule (20 mg total) by mouth daily., Disp: 30 capsule, Rfl: 12  Allergies  Allergen  Reactions  . Penicillins Itching    At about 65 yo with itching not serious. Did not require medical care    Review of Systems  Constitutional: Positive for malaise/fatigue. Negative for fever, chills and weight loss.  HENT: Positive for congestion and sore throat (mild scratchy). Negative for ear pain, hearing loss and nosebleeds.   Eyes: Negative for blurred vision and double vision.  Respiratory: Positive for cough, sputum production and wheezing (mild). Shortness of breath: mild.   Cardiovascular: Negative for chest pain, palpitations and leg swelling.  Gastrointestinal: Negative for heartburn, abdominal pain and blood in stool.  Genitourinary: Negative for dysuria, urgency and frequency.  Skin: Negative for rash.  Neurological: Negative for weakness and headaches.      Objective  Filed Vitals:   02/14/16 1029  BP: 111/66  Pulse: 82  Temp: 97.8 F (36.6 C)  TempSrc: Oral  Resp: 16  Height: 5\' 6"  (1.676 m)  Weight: 219 lb (99.338 kg)     Physical Exam  Constitutional: She is oriented to person, place, and time and well-developed, well-nourished, and in no distress. No distress.  HENT:  Head: Normocephalic and atraumatic.  Right Ear: External ear normal.  Left Ear: External ear normal.  Nose: Rhinorrhea (clear) present.  Mouth/Throat: Oropharynx is clear and moist.  Cardiovascular: Normal rate, regular rhythm and normal heart sounds.   Pulmonary/Chest: Effort normal. No respiratory distress. She has no wheezes. She has no rales.  Coarse breath sounds throughout  Neurological: She is alert and oriented to person, place, and time.  Vitals reviewed.     No results found for this or any previous visit (from the past 2160 hour(s)).   Assessment & Plan  1. Bronchitis  - azithromycin (ZITHROMAX) 250 MG tablet; Take 2 tabs on day 1, then 1 tablet daily on days 2-5.  Dispense: 6 tablet; Refill: 0 - dextromethorphan-guaiFENesin (MUCINEX DM) 30-600 MG 12hr tablet;  Take 1 tablet by mouth 2 (two) times daily as needed for cough.  Dispense: 20 tablet; Refill: 0

## 2016-02-22 ENCOUNTER — Other Ambulatory Visit: Payer: Self-pay | Admitting: Family Medicine

## 2016-02-22 MED ORDER — LOSARTAN POTASSIUM 100 MG PO TABS: 100.0000 mg | ORAL_TABLET | Freq: Every day | ORAL | Status: DC

## 2016-03-05 ENCOUNTER — Other Ambulatory Visit: Payer: Self-pay | Admitting: Family Medicine

## 2016-03-05 MED ORDER — ALENDRONATE SODIUM 70 MG PO TABS: 70.0000 mg | ORAL_TABLET | ORAL | Status: DC

## 2016-03-05 MED ORDER — ATORVASTATIN CALCIUM 20 MG PO TABS
10.0000 mg | ORAL_TABLET | Freq: Every day | ORAL | Status: DC
Start: 2016-03-05 — End: 2016-10-19

## 2016-03-13 NOTE — Progress Notes (Signed)
This is a mammogram report and not a bone density study.  Mammogram is ok and she should have repeat in 1 year.-jh

## 2016-04-18 ENCOUNTER — Other Ambulatory Visit: Payer: Self-pay | Admitting: Family Medicine

## 2016-04-18 DIAGNOSIS — F419 Anxiety disorder, unspecified: Secondary | ICD-10-CM

## 2016-04-19 MED ORDER — BUPROPION HCL ER (XL) 150 MG PO TB24: 150.0000 mg | ORAL_TABLET | Freq: Every day | ORAL | 3 refills | Status: DC

## 2016-04-24 ENCOUNTER — Encounter: Payer: Self-pay | Admitting: Family Medicine

## 2016-04-24 ENCOUNTER — Ambulatory Visit (INDEPENDENT_AMBULATORY_CARE_PROVIDER_SITE_OTHER): Payer: Federal, State, Local not specified - PPO | Admitting: Family Medicine

## 2016-04-24 VITALS — BP 130/73 | HR 67 | Temp 98.2°F | Resp 16 | Ht 66.0 in | Wt 224.0 lb

## 2016-04-24 DIAGNOSIS — F419 Anxiety disorder, unspecified: Secondary | ICD-10-CM

## 2016-04-24 DIAGNOSIS — E785 Hyperlipidemia, unspecified: Secondary | ICD-10-CM | POA: Diagnosis not present

## 2016-04-24 DIAGNOSIS — K2970 Gastritis, unspecified, without bleeding: Secondary | ICD-10-CM

## 2016-04-24 DIAGNOSIS — I10 Essential (primary) hypertension: Secondary | ICD-10-CM

## 2016-04-24 DIAGNOSIS — M810 Age-related osteoporosis without current pathological fracture: Secondary | ICD-10-CM | POA: Insufficient documentation

## 2016-04-24 MED ORDER — BUPROPION HCL ER (XL) 300 MG PO TB24: 300.0000 mg | ORAL_TABLET | Freq: Every day | ORAL | 6 refills | Status: DC

## 2016-04-24 NOTE — Progress Notes (Signed)
Name: Monica Chapman   MRN: [id number]    DOB: May 30, 1951   Date:04/24/2016       Progress Note  Subjective  Chief Complaint  Chief Complaint  Patient presents with  . Hypertension  . Hyperlipidemia  . Anxiety    HPI Here for f/u of HBP, elevated lipids and insomnia and anxiety.  She is somewhat less anxious on Wellbutrin, but she is still awakening multiple times during the night and having fitful sleep.  No problem-specific Assessment & Plan notes found for this encounter.   Past Medical History:  Diagnosis Date  . Arthritis   . Hypertension     Past Surgical History:  Procedure Laterality Date  . ABDOMINAL HYSTERECTOMY    . FOOT SURGERY      Family History  Problem Relation Age of Onset  . Breast cancer Mother 65  . Breast cancer Maternal Grandmother     Social History   Social History  . Marital status: Married    Spouse name: N/A  . Number of children: N/A  . Years of education: N/A   Occupational History  . Not on file.   Social History Main Topics  . Smoking status: Former Smoker    Packs/day: 0.25    Types: Cigarettes    Quit date: 10/28/2015  . Smokeless tobacco: Never Used  . Alcohol use 0.0 oz/week  . Drug use: No  . Sexual activity: Not on file   Other Topics Concern  . Not on file   Social History Narrative  . No narrative on file     Current Outpatient Prescriptions:  .  alendronate (FOSAMAX) 70 MG tablet, Take 1 tablet (70 mg total) by mouth once a week., Disp: 4 tablet, Rfl: 6 .  atorvastatin (LIPITOR) 20 MG tablet, Take 0.5 tablets (10 mg total) by mouth daily at 6 PM., Disp: 30 tablet, Rfl: 2 .  buPROPion (WELLBUTRIN XL) 300 MG 24 hr tablet, Take 1 tablet (300 mg total) by mouth daily., Disp: 30 tablet, Rfl: 6 .  calcium-vitamin D (OSCAL WITH D) 500-200 MG-UNIT per tablet, Take 1 tablet by mouth., Disp: , Rfl:  .  Cetirizine HCl 10 MG CAPS, Take 10 mg by mouth at bedtime. , Disp: , Rfl:  .  hydrochlorothiazide (HYDRODIURIL) 12.5  MG tablet, Take 1 tablet (12.5 mg total) by mouth daily., Disp: 30 tablet, Rfl: 3 .  losartan (COZAAR) 100 MG tablet, Take 1 tablet (100 mg total) by mouth daily., Disp: 90 tablet, Rfl: 0 .  Melatonin 3 MG TABS, Take by mouth as needed., Disp: , Rfl:  .  NEOMYCIN-POLYMYXIN-HYDROCORTISONE (CORTISPORIN) 1 % SOLN otic solution, Apply 1-2 drops to toe BID after soaking, Disp: 10 mL, Rfl: 1 .  omeprazole (PRILOSEC) 20 MG capsule, Take 1 capsule (20 mg total) by mouth daily., Disp: 30 capsule, Rfl: 12  Allergies  Allergen Reactions  . Penicillins Itching    At about 65 yo with itching not serious. Did not require medical care     Review of Systems  Constitutional: Negative for chills, fever, malaise/fatigue and weight loss.  HENT: Negative for hearing loss.   Eyes: Negative for blurred vision and double vision.  Respiratory: Negative for cough, shortness of breath and wheezing.   Cardiovascular: Negative for chest pain, palpitations and leg swelling.  Gastrointestinal: Negative for abdominal pain, blood in stool and heartburn.  Genitourinary: Negative for dysuria, frequency and urgency.  Musculoskeletal: Positive for back pain.  Skin: Negative for rash.  Neurological: Negative  for dizziness, tremors, weakness and headaches.  Psychiatric/Behavioral: The patient is nervous/anxious and has insomnia.       Objective  Vitals:   04/24/16 0905  BP: 130/73  Pulse: 67  Resp: 16  Temp: 98.2 F (36.8 C)  TempSrc: Oral  Weight: 224 lb (101.6 kg)  Height: 5\' 6"  (1.676 m)    Physical Exam  Constitutional: She is oriented to person, place, and time and well-developed, well-nourished, and in no distress. No distress.  HENT:  Head: Normocephalic and atraumatic.  Eyes: Conjunctivae and EOM are normal. Pupils are equal, round, and reactive to light. No scleral icterus.  Neck: Normal range of motion. Neck supple. Carotid bruit is not present. No thyromegaly present.  Cardiovascular: Normal  rate and regular rhythm.  Exam reveals no gallop and no friction rub.   Murmur heard.  Systolic murmur is present with a grade of 2/6  URSB  Pulmonary/Chest: Effort normal and breath sounds normal. No respiratory distress. She has no wheezes. She has no rales.  Abdominal: Soft. Bowel sounds are normal. She exhibits no distension, no abdominal bruit and no mass. There is no tenderness.  Musculoskeletal: She exhibits no edema.  Lymphadenopathy:    She has no cervical adenopathy.  Neurological: She is alert and oriented to person, place, and time.  Psychiatric:  Affect seems minimally depressed.  No suicidal ideation.  Vitals reviewed.      No results found for this or any previous visit (from the past 2160 hour(s)).   Assessment & Plan  Problem List Items Addressed This Visit      Cardiovascular and Mediastinum   BP (high blood pressure) - Primary   Relevant Orders   COMPLETE METABOLIC PANEL WITH GFR     Digestive   Gastritis   Relevant Orders   CBC with Differential     Musculoskeletal and Integument   Osteoporosis     Other   HLD (hyperlipidemia)   Relevant Orders   Lipid Profile   Anxiety disorder   Relevant Medications   buPROPion (WELLBUTRIN XL) 300 MG 24 hr tablet    Other Visit Diagnoses   None.     Meds ordered this encounter  Medications  . buPROPion (WELLBUTRIN XL) 300 MG 24 hr tablet    Sig: Take 1 tablet (300 mg total) by mouth daily.    Dispense:  30 tablet    Refill:  6   1. Anxiety disorder, unspecified anxiety disorder type  - buPROPion (WELLBUTRIN XL) 300 MG 24 hr tablet; Take 1 tablet (300 mg total) by mouth daily.  Dispense: 30 tablet; Refill: 6  2. Essential hypertension Cont Losartan and HCTZ - COMPLETE METABOLIC PANEL WITH GFR  3. Gastritis Cont Omeprazole - CBC with Differential  4. HLD (hyperlipidemia) Cont Lipitor - Lipid Profile  5. Osteoporosis Cont Fosamax.

## 2016-05-01 ENCOUNTER — Other Ambulatory Visit: Payer: Federal, State, Local not specified - PPO

## 2016-05-01 LAB — CBC WITH DIFFERENTIAL/PLATELET
Basophils Absolute: 0 cells/uL (ref 0–200)
Basophils Relative: 0 %
EOS PCT: 2 %
Eosinophils Absolute: 138 cells/uL (ref 15–500)
HEMATOCRIT: 36.8 % (ref 35.0–45.0)
Hemoglobin: 12.5 g/dL (ref 11.7–15.5)
LYMPHS PCT: 34 %
Lymphs Abs: 2346 cells/uL (ref 850–3900)
MCH: 31.2 pg (ref 27.0–33.0)
MCHC: 34 g/dL (ref 32.0–36.0)
MCV: 91.8 fL (ref 80.0–100.0)
MPV: 9.3 fL (ref 7.5–12.5)
Monocytes Absolute: 621 cells/uL (ref 200–950)
Monocytes Relative: 9 %
NEUTROS PCT: 55 %
Neutro Abs: 3795 cells/uL (ref 1500–7800)
Platelets: 249 10*3/uL (ref 140–400)
RBC: 4.01 MIL/uL (ref 3.80–5.10)
RDW: 13.3 % (ref 11.0–15.0)
WBC: 6.9 10*3/uL (ref 3.8–10.8)

## 2016-05-02 LAB — COMPLETE METABOLIC PANEL WITH GFR
ALT: 19 U/L (ref 6–29)
AST: 16 U/L (ref 10–35)
Albumin: 4.3 g/dL (ref 3.6–5.1)
Alkaline Phosphatase: 53 U/L (ref 33–130)
BUN: 24 mg/dL (ref 7–25)
CO2: 24 mmol/L (ref 20–31)
Calcium: 8.7 mg/dL (ref 8.6–10.4)
Chloride: 106 mmol/L (ref 98–110)
Creat: 0.85 mg/dL (ref 0.50–0.99)
GFR, EST NON AFRICAN AMERICAN: 73 mL/min (ref >=60)
GFR, Est African American: 84 mL/min (ref >=60)
GLUCOSE: 95 mg/dL (ref 65–99)
POTASSIUM: 4.8 mmol/L (ref 3.5–5.3)
SODIUM: 139 mmol/L (ref 135–146)
Total Bilirubin: 0.4 mg/dL (ref 0.2–1.2)
Total Protein: 6.6 g/dL (ref 6.1–8.1)

## 2016-05-02 LAB — LIPID PANEL
CHOL/HDL RATIO: 3.1 ratio (ref <=5.0)
Cholesterol: 177 mg/dL (ref 125–200)
HDL: 58 mg/dL (ref >=46)
LDL CALC: 95 mg/dL (ref <130)
Triglycerides: 121 mg/dL (ref <150)
VLDL: 24 mg/dL (ref <30)

## 2016-05-03 ENCOUNTER — Encounter: Payer: Self-pay | Admitting: *Deleted

## 2016-06-14 ENCOUNTER — Other Ambulatory Visit: Payer: Self-pay | Admitting: Family Medicine

## 2016-07-03 ENCOUNTER — Ambulatory Visit (INDEPENDENT_AMBULATORY_CARE_PROVIDER_SITE_OTHER): Payer: Federal, State, Local not specified - PPO | Admitting: Family Medicine

## 2016-07-03 ENCOUNTER — Encounter: Payer: Self-pay | Admitting: Family Medicine

## 2016-07-03 VITALS — BP 123/67 | HR 66 | Temp 97.9°F | Resp 16 | Ht 66.0 in | Wt 224.0 lb

## 2016-07-03 DIAGNOSIS — E782 Mixed hyperlipidemia: Secondary | ICD-10-CM | POA: Diagnosis not present

## 2016-07-03 DIAGNOSIS — F411 Generalized anxiety disorder: Secondary | ICD-10-CM

## 2016-07-03 DIAGNOSIS — M810 Age-related osteoporosis without current pathological fracture: Secondary | ICD-10-CM

## 2016-07-03 DIAGNOSIS — K2900 Acute gastritis without bleeding: Secondary | ICD-10-CM

## 2016-07-03 DIAGNOSIS — I10 Essential (primary) hypertension: Secondary | ICD-10-CM | POA: Diagnosis not present

## 2016-07-03 DIAGNOSIS — M5440 Lumbago with sciatica, unspecified side: Secondary | ICD-10-CM

## 2016-07-03 NOTE — Patient Instructions (Signed)
Patient has already had flu shot.

## 2016-07-03 NOTE — Progress Notes (Signed)
Name: Monica Chapman   MRN: [id number]    DOB: 1950-10-17   Date:07/03/2016       Progress Note  Subjective  Chief Complaint  Chief Complaint  Patient presents with  . Anxiety  . Hypertension  . Hyperlipidemia    HPI Here for f/u of anxiety, insomnia, HBP and elevated lipids.  Anxiety is doing well on Wellbutrin.  Sleep is doing better.  Able to stay asleep most nights.  Has some chronic back and feet pain.  Takes Tylenol 500, 2 prn.  Overall doing fairly well.   No problem-specific Assessment & Plan notes found for this encounter.   Past Medical History:  Diagnosis Date  . Arthritis   . Hypertension     Past Surgical History:  Procedure Laterality Date  . ABDOMINAL HYSTERECTOMY    . FOOT SURGERY      Family History  Problem Relation Age of Onset  . Breast cancer Mother 58  . Breast cancer Maternal Grandmother     Social History   Social History  . Marital status: Married    Spouse name: N/A  . Number of children: N/A  . Years of education: N/A   Occupational History  . Not on file.   Social History Main Topics  . Smoking status: Former Smoker    Packs/day: 0.25    Types: Cigarettes    Quit date: 10/28/2015  . Smokeless tobacco: Never Used  . Alcohol use 0.0 oz/week  . Drug use: No  . Sexual activity: Not on file   Other Topics Concern  . Not on file   Social History Narrative  . No narrative on file     Current Outpatient Prescriptions:  .  alendronate (FOSAMAX) 70 MG tablet, Take 1 tablet (70 mg total) by mouth once a week., Disp: 4 tablet, Rfl: 6 .  atorvastatin (LIPITOR) 20 MG tablet, Take 0.5 tablets (10 mg total) by mouth daily at 6 PM., Disp: 30 tablet, Rfl: 2 .  buPROPion (WELLBUTRIN XL) 300 MG 24 hr tablet, Take 1 tablet (300 mg total) by mouth daily., Disp: 30 tablet, Rfl: 6 .  calcium-vitamin D (OSCAL WITH D) 500-200 MG-UNIT per tablet, Take 1 tablet by mouth., Disp: , Rfl:  .  Cetirizine HCl 10 MG CAPS, Take 10 mg by mouth at bedtime.  , Disp: , Rfl:  .  hydrochlorothiazide (HYDRODIURIL) 12.5 MG tablet, take 1 tablet by mouth once daily, Disp: 90 tablet, Rfl: 3 .  losartan (COZAAR) 100 MG tablet, take 1 tablet by mouth once daily, Disp: 90 tablet, Rfl: 3 .  Melatonin 3 MG TABS, Take by mouth as needed., Disp: , Rfl:  .  NEOMYCIN-POLYMYXIN-HYDROCORTISONE (CORTISPORIN) 1 % SOLN otic solution, Apply 1-2 drops to toe BID after soaking, Disp: 10 mL, Rfl: 1 .  omeprazole (PRILOSEC) 20 MG capsule, Take 1 capsule (20 mg total) by mouth daily., Disp: 30 capsule, Rfl: 12  Allergies  Allergen Reactions  . Penicillins Itching    At about 65 yo with itching not serious. Did not require medical care     Review of Systems  Constitutional: Negative for chills, fever, malaise/fatigue and weight loss.  HENT: Negative for hearing loss.   Eyes: Negative for blurred vision and double vision.  Respiratory: Negative for cough, shortness of breath and wheezing.   Cardiovascular: Negative for chest pain, palpitations and leg swelling.  Gastrointestinal: Negative for abdominal pain, blood in stool and heartburn.  Genitourinary: Negative for dysuria, flank pain and urgency.  Musculoskeletal: Positive for back pain and joint pain. Negative for myalgias.  Skin: Negative for rash.  Neurological: Negative for dizziness, tremors, weakness and headaches.      Objective  Vitals:   07/03/16 0906  BP: 123/67  Pulse: 66  Resp: 16  Temp: 97.9 F (36.6 C)  TempSrc: Oral  Weight: 224 lb (101.6 kg)  Height: _0  (1.676 m)    Physical Exam  Constitutional: She is oriented to person, place, and time and well-developed, well-nourished, and in no distress. No distress.  HENT:  Head: Normocephalic and atraumatic.  Eyes: Conjunctivae and EOM are normal. Pupils are equal, round, and reactive to light. No scleral icterus.  Neck: Normal range of motion. Neck supple. Carotid bruit is not present. No thyromegaly present.  Cardiovascular: Normal rate  and regular rhythm.  Exam reveals no gallop and no friction rub.   Murmur heard.  Systolic murmur is present with a grade of 2/6  URSB  Pulmonary/Chest: Effort normal and breath sounds normal. No respiratory distress. She has no wheezes. She has no rales.  Abdominal: Soft. Bowel sounds are normal. She exhibits no distension and no mass. There is no tenderness.  obese  Musculoskeletal: She exhibits no edema.  Lymphadenopathy:    She has no cervical adenopathy.  Neurological: She is alert and oriented to person, place, and time.  Vitals reviewed.      Recent Results (from the past 2160 hour(s))  COMPLETE METABOLIC PANEL WITH GFR     Status: None   Collection Time: 05/01/16  8:59 AM  Result Value Ref Range   Sodium 139 135 - 146 mmol/L   Potassium 4.8 3.5 - 5.3 mmol/L   Chloride 106 98 - 110 mmol/L   CO2 24 20 - 31 mmol/L   Glucose, Bld 95 65 - 99 mg/dL   BUN 24 7 - 25 mg/dL   Creat 0.85 0.50 - 0.99 mg/dL    Comment:   For patients > or = 63 years of age: The upper reference limit for Creatinine is approximately 13% higher for people identified as African-American.      Total Bilirubin 0.4 0.2 - 1.2 mg/dL   Alkaline Phosphatase 53 33 - 130 U/L   AST 16 10 - 35 U/L   ALT 19 6 - 29 U/L   Total Protein 6.6 6.1 - 8.1 g/dL   Albumin 4.3 3.6 - 5.1 g/dL   Calcium 8.7 8.6 - 10.4 mg/dL   GFR, Est African American 84 >=60 mL/min   GFR, Est Non African American 73 >=60 mL/min  Lipid Profile     Status: None   Collection Time: 05/01/16  8:59 AM  Result Value Ref Range   Cholesterol 177 125 - 200 mg/dL   Triglycerides 121 <150 mg/dL   HDL 58 >=46 mg/dL   Total CHOL/HDL Ratio 3.1 <=5.0 Ratio   VLDL 24 <30 mg/dL   LDL Cholesterol 95 <130 mg/dL    Comment:   Total Cholesterol/HDL Ratio:CHD Risk                        Coronary Heart Disease Risk Table                                        Men       Women          1/2 Average Risk  3.4        3.3              Average  Risk              5.0        4.4           2X Average Risk              9.6        7.1           3X Average Risk             23.4       11.0 Use the calculated Patient Ratio above and the CHD Risk table  to determine the patient's CHD Risk.   CBC with Differential     Status: None   Collection Time: 05/01/16  8:59 AM  Result Value Ref Range   WBC 6.9 3.8 - 10.8 K/uL   RBC 4.01 3.80 - 5.10 MIL/uL   Hemoglobin 12.5 11.7 - 15.5 g/dL   HCT 36.8 35.0 - 45.0 %   MCV 91.8 80.0 - 100.0 fL   MCH 31.2 27.0 - 33.0 pg   MCHC 34.0 32.0 - 36.0 g/dL   RDW 13.3 11.0 - 15.0 %   Platelets 249 140 - 400 K/uL   MPV 9.3 7.5 - 12.5 fL   Neutro Abs 3,795 1,500 - 7,800 cells/uL   Lymphs Abs 2,346 850 - 3,900 cells/uL   Monocytes Absolute 621 200 - 950 cells/uL   Eosinophils Absolute 138 15 - 500 cells/uL   Basophils Absolute 0 0 - 200 cells/uL   Neutrophils Relative % 55 %   Lymphocytes Relative 34 %   Monocytes Relative 9 %   Eosinophils Relative 2 %   Basophils Relative 0 %   Smear Review Criteria for review not met     Comment: ** Please note change in unit of measure and reference range(s). **     Assessment & Plan  Problem List Items Addressed This Visit      Cardiovascular and Mediastinum   BP (high blood pressure) - Primary     Digestive   Gastritis     Musculoskeletal and Integument   Osteoporosis     Other   Back ache   HLD (hyperlipidemia)   Anxiety disorder    Other Visit Diagnoses   None.     No orders of the defined types were placed in this encounter.  1. Essential hypertension Cont meds  2. Other acute gastritis without hemorrhage  Cont meds 3. Age related osteoporosis, unspecified pathological fracture presence Cont meds  4. Low back pain with sciatica, sciatica laterality unspecified, unspecified back pain laterality, unspecified chronicity   5. Mixed hyperlipidemia Cont meds  6. Generalized anxiety disorder Cont meds

## 2016-09-10 ENCOUNTER — Ambulatory Visit (INDEPENDENT_AMBULATORY_CARE_PROVIDER_SITE_OTHER): Payer: Federal, State, Local not specified - PPO | Admitting: Family Medicine

## 2016-09-10 ENCOUNTER — Other Ambulatory Visit: Payer: Self-pay | Admitting: *Deleted

## 2016-09-10 ENCOUNTER — Encounter: Payer: Self-pay | Admitting: Family Medicine

## 2016-09-10 VITALS — BP 129/72 | HR 92 | Temp 98.1°F | Resp 16 | Ht 66.0 in | Wt 220.0 lb

## 2016-09-10 DIAGNOSIS — H1032 Unspecified acute conjunctivitis, left eye: Secondary | ICD-10-CM | POA: Diagnosis not present

## 2016-09-10 DIAGNOSIS — J40 Bronchitis, not specified as acute or chronic: Secondary | ICD-10-CM

## 2016-09-10 MED ORDER — DM-GUAIFENESIN ER 30-600 MG PO TB12: 1.0000 | ORAL_TABLET | Freq: Two times a day (BID) | ORAL | 1 refills | Status: DC | PRN

## 2016-09-10 MED ORDER — PREDNISONE 10 MG PO TABS: ORAL_TABLET | ORAL | 0 refills | Status: DC

## 2016-09-10 MED ORDER — AZITHROMYCIN 250 MG PO TABS: ORAL_TABLET | ORAL | 0 refills | Status: DC

## 2016-09-10 MED ORDER — GENTAMICIN SULFATE 0.3 % OP SOLN: 2.0000 [drp] | Freq: Four times a day (QID) | OPHTHALMIC | 0 refills | Status: DC

## 2016-09-10 NOTE — Progress Notes (Signed)
Name: Monica Chapman   MRN: [id number]    DOB: 06-12-1951   Date:09/10/2016       Progress Note  Subjective  Chief Complaint  Chief Complaint  Patient presents with  . Nasal Congestion  . Cough  . Conjunctivitis    HPI Pt in c/o nasal congestion x 2-3 weeks.  Cough started with the congestion.  Both have steadily worsened.  Lots of pnd.  No fever.  No chills or sweats.  Some wheezing.  Mild SOB with activity  No problem-specific Assessment & Plan notes found for this encounter.   Past Medical History:  Diagnosis Date  . Arthritis   . Hypertension     Social History  Substance Use Topics  . Smoking status: Former Smoker    Packs/day: 0.25    Types: Cigarettes    Quit date: 10/28/2015  . Smokeless tobacco: Never Used  . Alcohol use 0.0 oz/week     Current Outpatient Prescriptions:  .  alendronate (FOSAMAX) 70 MG tablet, Take 1 tablet (70 mg total) by mouth once a week., Disp: 4 tablet, Rfl: 6 .  atorvastatin (LIPITOR) 20 MG tablet, Take 0.5 tablets (10 mg total) by mouth daily at 6 PM., Disp: 30 tablet, Rfl: 2 .  buPROPion (WELLBUTRIN XL) 300 MG 24 hr tablet, Take 1 tablet (300 mg total) by mouth daily., Disp: 30 tablet, Rfl: 6 .  calcium-vitamin D (OSCAL WITH D) 500-200 MG-UNIT per tablet, Take 1 tablet by mouth., Disp: , Rfl:  .  Cetirizine HCl 10 MG CAPS, Take 10 mg by mouth at bedtime. , Disp: , Rfl:  .  hydrochlorothiazide (HYDRODIURIL) 12.5 MG tablet, take 1 tablet by mouth once daily, Disp: 90 tablet, Rfl: 3 .  losartan (COZAAR) 100 MG tablet, take 1 tablet by mouth once daily, Disp: 90 tablet, Rfl: 3 .  Melatonin 3 MG TABS, Take by mouth as needed., Disp: , Rfl:  .  NEOMYCIN-POLYMYXIN-HYDROCORTISONE (CORTISPORIN) 1 % SOLN otic solution, Apply 1-2 drops to toe BID after soaking, Disp: 10 mL, Rfl: 1 .  omeprazole (PRILOSEC) 20 MG capsule, Take 1 capsule (20 mg total) by mouth daily., Disp: 30 capsule, Rfl: 12  Allergies  Allergen Reactions  . Penicillins Itching     At about 65 yo with itching not serious. Did not require medical care    Review of Systems  Constitutional: Positive for malaise/fatigue. Negative for chills, fever and weight loss.  HENT: Positive for congestion. Negative for hearing loss and tinnitus.   Eyes: Negative for blurred vision and double vision.  Respiratory: Positive for cough, sputum production, shortness of breath and wheezing.   Cardiovascular: Negative for chest pain, palpitations and leg swelling.  Gastrointestinal: Negative for abdominal pain, blood in stool and heartburn.  Genitourinary: Negative for dysuria, frequency and urgency.  Musculoskeletal: Negative for joint pain and myalgias.  Skin: Positive for rash.  Neurological: Positive for headaches. Negative for dizziness, tingling, tremors and weakness.      Objective  Vitals:   09/10/16 1324  BP: 129/72  Pulse: 92  Resp: 16  Temp: 98.1 F (36.7 C)  TempSrc: Oral  Weight: 220 lb (99.8 kg)  Height: 5\' 6"  (1.676 m)     Physical Exam  Constitutional: She is well-developed, well-nourished, and in no distress. No distress.  HENT:  Head: Normocephalic and atraumatic.  Right Ear: External ear normal.  Left Ear: External ear normal.  Nose: Mucosal edema and rhinorrhea present. Right sinus exhibits no maxillary sinus tenderness and no  frontal sinus tenderness. Left sinus exhibits no maxillary sinus tenderness and no frontal sinus tenderness.  Mouth/Throat: Oropharynx is clear and moist.  Eyes: EOM are normal. Pupils are equal, round, and reactive to light. Right conjunctiva is not injected. Left conjunctiva is injected. No scleral icterus.  Neck: No thyromegaly present.  Cardiovascular: Normal rate, regular rhythm and normal heart sounds.  Exam reveals no gallop and no friction rub.   No murmur heard. Pulmonary/Chest: Effort normal. No respiratory distress. She has wheezes. She has no rales.  Musculoskeletal: She exhibits no edema.  Lymphadenopathy:     She has no cervical adenopathy.  Vitals reviewed.     No results found for this or any previous visit (from the past 2160 hour(s)).   Assessment & Plan  1. Bronchitis  - azithromycin (ZITHROMAX) 250 MG tablet; Take 2 tabs on day 1, then 1 table daily on days 2-5  Dispense: 6 tablet; Refill: 0 - dextromethorphan-guaiFENesin (MUCINEX DM) 30-600 MG 12hr tablet; Take 1 tablet by mouth 2 (two) times daily as needed for cough.  Dispense: 20 tablet; Refill: 1 - predniSONE (DELTASONE) 10 MG tablet; Take 3 tablets daily for 2 days, 2 tablets daily for 2 days, then 1 tablet daily for 2 days  (3, 3, 2, 2, 1, 1.)  Dispense: 12 tablet; Refill: 0  2. Acute conjunctivitis of left eye, unspecified acute conjunctivitis type  - gentamicin (GARAMYCIN) 0.3 % ophthalmic solution; Place 2 drops into the left eye 4 (four) times daily.  Dispense: 5 mL; Refill: 0

## 2016-10-01 ENCOUNTER — Other Ambulatory Visit: Payer: Self-pay | Admitting: Family Medicine

## 2016-10-01 DIAGNOSIS — M810 Age-related osteoporosis without current pathological fracture: Secondary | ICD-10-CM

## 2016-10-01 MED ORDER — ALENDRONATE SODIUM 70 MG PO TABS: 70.0000 mg | ORAL_TABLET | ORAL | 6 refills | Status: DC

## 2016-10-19 ENCOUNTER — Other Ambulatory Visit: Payer: Self-pay | Admitting: Family Medicine

## 2016-10-19 MED ORDER — ATORVASTATIN CALCIUM 20 MG PO TABS: 10.0000 mg | ORAL_TABLET | Freq: Every day | ORAL | 2 refills | Status: DC

## 2016-11-30 ENCOUNTER — Other Ambulatory Visit: Payer: Self-pay | Admitting: Family Medicine

## 2016-11-30 DIAGNOSIS — K2970 Gastritis, unspecified, without bleeding: Secondary | ICD-10-CM

## 2016-12-03 ENCOUNTER — Ambulatory Visit (INDEPENDENT_AMBULATORY_CARE_PROVIDER_SITE_OTHER): Payer: Federal, State, Local not specified - PPO | Admitting: Family Medicine

## 2016-12-03 ENCOUNTER — Encounter: Payer: Self-pay | Admitting: Family Medicine

## 2016-12-03 VITALS — BP 145/75 | HR 68 | Temp 97.9°F | Resp 16 | Ht 66.0 in | Wt 228.0 lb

## 2016-12-03 DIAGNOSIS — F411 Generalized anxiety disorder: Secondary | ICD-10-CM | POA: Diagnosis not present

## 2016-12-03 DIAGNOSIS — Z803 Family history of malignant neoplasm of breast: Secondary | ICD-10-CM | POA: Diagnosis not present

## 2016-12-03 DIAGNOSIS — K2900 Acute gastritis without bleeding: Secondary | ICD-10-CM | POA: Diagnosis not present

## 2016-12-03 DIAGNOSIS — I10 Essential (primary) hypertension: Secondary | ICD-10-CM

## 2016-12-03 DIAGNOSIS — K589 Irritable bowel syndrome without diarrhea: Secondary | ICD-10-CM | POA: Diagnosis not present

## 2016-12-03 DIAGNOSIS — E782 Mixed hyperlipidemia: Secondary | ICD-10-CM

## 2016-12-03 MED ORDER — HYDROCHLOROTHIAZIDE 25 MG PO TABS: 25.0000 mg | ORAL_TABLET | Freq: Every day | ORAL | 6 refills | Status: DC

## 2016-12-03 NOTE — Progress Notes (Signed)
Name: Monica Chapman   MRN: [id number]    DOB: 08-Jul-1951   Date:12/03/2016       Progress Note  Subjective  Chief Complaint  Chief Complaint  Patient presents with  . Hypertension    HPI Here for f/u of HBP.  Has elevated lipids and depression that are both doing well.  She needs mammogram also.  No problem-specific Assessment & Plan notes found for this encounter.   Past Medical History:  Diagnosis Date  . Arthritis   . Hypertension     Past Surgical History:  Procedure Laterality Date  . ABDOMINAL HYSTERECTOMY    . FOOT SURGERY      Family History  Problem Relation Age of Onset  . Breast cancer Mother 81  . Breast cancer Maternal Grandmother     Social History   Social History  . Marital status: Married    Spouse name: N/A  . Number of children: N/A  . Years of education: N/A   Occupational History  . Not on file.   Social History Main Topics  . Smoking status: Former Smoker    Packs/day: 0.25    Types: Cigarettes    Quit date: 10/28/2015  . Smokeless tobacco: Never Used  . Alcohol use 0.0 oz/week  . Drug use: No  . Sexual activity: Not on file   Other Topics Concern  . Not on file   Social History Narrative  . No narrative on file     Current Outpatient Prescriptions:  .  alendronate (FOSAMAX) 70 MG tablet, Take 1 tablet (70 mg total) by mouth once a week., Disp: 4 tablet, Rfl: 6 .  atorvastatin (LIPITOR) 20 MG tablet, Take 0.5 tablets (10 mg total) by mouth daily at 6 PM., Disp: 30 tablet, Rfl: 2 .  buPROPion (WELLBUTRIN XL) 300 MG 24 hr tablet, Take 1 tablet (300 mg total) by mouth daily., Disp: 30 tablet, Rfl: 6 .  calcium-vitamin D (OSCAL WITH D) 500-200 MG-UNIT per tablet, Take 1 tablet by mouth., Disp: , Rfl:  .  Cetirizine HCl 10 MG CAPS, Take 10 mg by mouth at bedtime. , Disp: , Rfl:  .  hydrochlorothiazide (HYDRODIURIL) 25 MG tablet, Take 1 tablet (25 mg total) by mouth daily., Disp: 30 tablet, Rfl: 6 .  losartan (COZAAR) 100 MG  tablet, take 1 tablet by mouth once daily, Disp: 90 tablet, Rfl: 3 .  Melatonin 3 MG TABS, Take by mouth as needed., Disp: , Rfl:  .  omeprazole (PRILOSEC) 20 MG capsule, take 1 capsule by mouth once daily, Disp: 30 capsule, Rfl: 12  Allergies  Allergen Reactions  . Penicillins Itching    At about 66 yo with itching not serious. Did not require medical care     Review of Systems  Constitutional: Negative for chills, fever, malaise/fatigue and weight loss.  HENT: Negative for hearing loss and tinnitus.   Eyes: Negative for blurred vision and double vision.  Respiratory: Negative for cough, shortness of breath and wheezing.   Cardiovascular: Negative for chest pain, palpitations and leg swelling.  Gastrointestinal: Positive for abdominal pain (intermittant, mild diffuse). Negative for blood in stool and heartburn.  Genitourinary: Negative for dysuria, frequency and urgency.  Musculoskeletal: Negative for joint pain and myalgias.  Skin: Negative for rash.  Neurological: Negative for dizziness, tingling, tremors, weakness and headaches.      Objective  Vitals:   12/03/16 0900 12/03/16 1020  BP: 125/66 (!) 145/75  Pulse: 68   Resp: 16  Temp: 97.9 F (36.6 C)   TempSrc: Oral   Weight: 228 lb (103.4 kg)   Height: 5\' 6"  (1.676 m)     Physical Exam  Constitutional: She is oriented to person, place, and time and well-developed, well-nourished, and in no distress. No distress.  HENT:  Head: Normocephalic and atraumatic.  Eyes: Conjunctivae and EOM are normal. Pupils are equal, round, and reactive to light. No scleral icterus.  Neck: Normal range of motion. Neck supple. Carotid bruit is not present. No thyromegaly present.  Cardiovascular: Normal rate.  Exam reveals no gallop and no friction rub.   Murmur heard.  Systolic murmur is present with a grade of 2/6  URSB  Pulmonary/Chest: Effort normal and breath sounds normal. No respiratory distress. She has no wheezes. She has no  rales.  Abdominal: Soft. Bowel sounds are normal. She exhibits no distension and no mass. There is no tenderness.  Musculoskeletal: She exhibits no edema.  Lymphadenopathy:    She has no cervical adenopathy.  Neurological: She is alert and oriented to person, place, and time.  Vitals reviewed.      No results found for this or any previous visit (from the past 2160 hour(s)).   Assessment & Plan  Problem List Items Addressed This Visit      Cardiovascular and Mediastinum   BP (high blood pressure) - Primary   Relevant Medications   hydrochlorothiazide (HYDRODIURIL) 25 MG tablet     Digestive   Gastritis   IBS (irritable bowel syndrome)     Other   Family history of breast cancer   Relevant Orders   MM Digital Screening   HLD (hyperlipidemia)   Relevant Medications   hydrochlorothiazide (HYDRODIURIL) 25 MG tablet   Anxiety disorder      Meds ordered this encounter  Medications  . hydrochlorothiazide (HYDRODIURIL) 25 MG tablet    Sig: Take 1 tablet (25 mg total) by mouth daily.    Dispense:  30 tablet    Refill:  6   1. Essential hypertension conmt Losartan- hydrochlorothiazide (HYDRODIURIL) 25 MG tablet; Take 1 tablet (25 mg total) by mouth daily.  Dispense: 30 tablet; Refill: 6  2. Other acute gastritis without hemorrhage Cont Omeprazole  3. Irritable bowel syndrome, unspecified type   4. Mixed hyperlipidemia  Cont Lipitor 5. Family history of breast cancer Schedule yearly mammogram  6. Generalized anxiety disorder Cont Wellbutrin

## 2017-01-01 ENCOUNTER — Ambulatory Visit
Admission: RE | Admit: 2017-01-01 | Discharge: 2017-01-01 | Disposition: A | Discharge status: Home / Self Care | Payer: Federal, State, Local not specified - PPO | Source: Ambulatory Visit | Attending: Family Medicine | Admitting: Family Medicine

## 2017-01-01 DIAGNOSIS — Z1231 Encounter for screening mammogram for malignant neoplasm of breast: Secondary | ICD-10-CM | POA: Insufficient documentation

## 2017-01-01 DIAGNOSIS — Z803 Family history of malignant neoplasm of breast: Secondary | ICD-10-CM

## 2017-01-21 ENCOUNTER — Other Ambulatory Visit: Payer: Self-pay

## 2017-01-21 DIAGNOSIS — F411 Generalized anxiety disorder: Secondary | ICD-10-CM

## 2017-01-21 NOTE — Telephone Encounter (Signed)
Last ov 12/03/16 Last filled 04/24/16

## 2017-01-22 ENCOUNTER — Ambulatory Visit: Payer: Federal, State, Local not specified - PPO | Admitting: Family Medicine

## 2017-01-22 MED ORDER — BUPROPION HCL ER (XL) 300 MG PO TB24: 300.0000 mg | ORAL_TABLET | Freq: Every day | ORAL | 5 refills | Status: DC

## 2017-03-26 ENCOUNTER — Ambulatory Visit (INDEPENDENT_AMBULATORY_CARE_PROVIDER_SITE_OTHER): Payer: Federal, State, Local not specified - PPO | Admitting: Family Medicine

## 2017-03-26 ENCOUNTER — Encounter: Payer: Self-pay | Admitting: Family Medicine

## 2017-03-26 VITALS — BP 108/63 | HR 88 | Temp 98.6°F | Resp 16 | Ht 66.0 in | Wt 215.8 lb

## 2017-03-26 DIAGNOSIS — F4322 Adjustment disorder with anxiety: Secondary | ICD-10-CM

## 2017-03-26 DIAGNOSIS — F5101 Primary insomnia: Secondary | ICD-10-CM | POA: Diagnosis not present

## 2017-03-26 DIAGNOSIS — G4700 Insomnia, unspecified: Secondary | ICD-10-CM | POA: Insufficient documentation

## 2017-03-26 MED ORDER — ESCITALOPRAM OXALATE 10 MG PO TABS: 10.0000 mg | ORAL_TABLET | Freq: Every day | ORAL | 3 refills | Status: DC

## 2017-03-26 NOTE — Assessment & Plan Note (Addendum)
Acute on chronic problem, now worse with life stressor and anxiety/acute adjustment Treat underlying problem Start SSRI Escitalopram, see A&P Continue Wellbutrin XL 300mg  daily - cautioned this may worsen insomnia - may discuss change of med in future, titrate down and switch to Remeron vs Trazodone if needed for better sleep. Now since former smoker, does not need wellbutrin for smoking

## 2017-03-26 NOTE — Assessment & Plan Note (Addendum)
Suspected new acute adjustment disorder with life stressors with mixed anxiety and depression now with gradual worsening causing more difficulty functioning, previously coped well, now affecting with worse poor sleep / worrying and family legal issue stress is primary stressor. Suspected insomnia is secondary to anxiety/mood. -GAD7: 16, somewhat difficult / PHQ9: 15 somewhat difficult - Currently on Wellbutrin. No other prior meds, no known SSRI therapy. - No prior Psych / counseling  Plan: 1. Discussion on adjustment disorder vs anxiety management, complications, likely contributing to insomnia 2. Start Escitalopram 10mg  daily AM with food, counseling on potential side effects risks, reviewed possible GI intolerance, insomnia (although likely to improve this given anxiety likely source of insomnia), sexual dysfunction, reviewed black box warning inc suicidal (no prior history, unlikely concern) - anticipate 4-6 weeks for notable effect, may need titrate dose to 20 in future - Continue Wellbutrin XL 300mg  daily 3. Advised recommend therapy / counseling in future - explained treatment is both with medication and therapy, however she declined this option and does not want information on therapists. Advised she may not get as much benefit and given the nature of her problems and stressors she would benefit from this 4. Follow-up 4-6 weeks anxiety, med adjust, GAD7/PHQ9

## 2017-03-26 NOTE — Progress Notes (Signed)
Subjective:    Patient ID: Monica Chapman, female    DOB: 1951-05-30, 66 y.o.   MRN: [id number]  Monica Chapman is a 66 y.o. female presenting on 03/26/2017 for Stress (onset 3 weeks can't sleep)   HPI   ACUTE ADJUSTMENT DISORDER with Anxiety / Depesion / Chronic Insomnia - Reports prior history of depression and anxiety, however she does not initially endorse this when asked. Chart review confirms discussion with prior PCP Dr Luan Pulling within past 1-2 years, seemed to have difficulty with insomnia and restless sleep with anxiety at times, intermittent worsening, also was on Wellbutrin to help quit smoking. Overall had been doing well, for while, until recent life stressors. - Today reports that she is here to discuss treatment options now with acute worsening stress affecting her function. She briefly describes new life stressor involving family with son and granddaughter, legal issues, states the court date is this Friday in 3 days, and it is causing more stress waiting for it. She does not disclose specific details of the nature of the problem at this time. - Describes symptoms with "gradual building up of stress" seems to be over years and different things trigger it to make it worse - Affecting her sleep, with known chronic insomnia, already treated in past with aspirin + benadryl and melatonin 66m nightly for past 4-5 years. Describes some sleep problems onset after came off of chronic pain medicine in past - Taking Wellbutrin XL 306mdaily for past 1 year - Never seen Psychiatry, Psychology or therapist in past, she is not interested in this option - Admits poor appetite recently - See PHQ/GAD for symptoms below - Denies any suicidal or homicidal ideation   GAD 7 : Generalized Anxiety Score 03/26/2017  Nervous, Anxious, on Edge 3  Control/stop worrying 3  Worry too much - different things 2  Trouble relaxing 2  Restless 2  Easily annoyed or irritable 1  Afraid - awful might  happen 3  Total GAD 7 Score 16  Anxiety Difficulty Somewhat difficult    [facility] 03/26/2017 12/03/2016 04/24/2016  Decreased Interest 2 1 0  Down, Depressed, Hopeless 3 1 0  PHQ - 2 Score 5 2 0  Altered sleeping 3 0 -  Tired, decreased energy 2 0 -  Change in appetite 3 0 -  Feeling bad or failure about yourself  0 0 -  Trouble concentrating 1 1 -  Moving slowly or fidgety/restless 1 0 -  Suicidal thoughts 0 0 -  PHQ-9 Score 15 3 -  Difficult doing work/chores - Not difficult at all -     Social History  Substance Use Topics  . Smoking status: Former Smoker    Packs/day: 0.25    Types: Cigarettes    Quit date: 10/28/2015  . Smokeless tobacco: Never Used  . Alcohol use 0.0 oz/week    Review of Systems Per HPI unless specifically indicated above     Objective:    BP 108/63   Pulse 88   Temp 98.6 F (37 C) (Oral)   Resp 16   Ht _0  (1.676 m)   Wt 215 lb 12.8 oz (97.9 kg)   BMI 34.83 kg/m   Wt Readings from Last 3 Encounters:  03/26/17 215 lb 12.8 oz (97.9 kg)  12/03/16 228 lb (103.4 kg)  09/10/16 220 lb (99.8 kg)    Physical Exam  Constitutional: She is oriented to person, place, and time. She appears well-developed and well-nourished.  No distress.  Well-appearing, comfortable, cooperative, overweight  HENT:  Head: Normocephalic and atraumatic.  Cardiovascular: Normal rate.   Pulmonary/Chest: Effort normal.  Musculoskeletal: She exhibits no edema.  Neurological: She is alert and oriented to person, place, and time.  Skin: Skin is warm and dry. No rash noted. She is not diaphoretic. No erythema.  Psychiatric: Her behavior is normal.  Well groomed, good eye contact, normal speech and thoughts. Tearful episodes when discussing concerns. Slightly depressed mood and congruent affect.  Nursing note and vitals reviewed.  Results for orders placed or performed in visit on 04/24/16  COMPLETE METABOLIC PANEL WITH GFR  Result Value Ref Range   Sodium  139 135 - 146 mmol/L   Potassium 4.8 3.5 - 5.3 mmol/L   Chloride 106 98 - 110 mmol/L   CO2 24 20 - 31 mmol/L   Glucose, Bld 95 65 - 99 mg/dL   BUN 24 7 - 25 mg/dL   Creat 0.85 0.50 - 0.99 mg/dL   Total Bilirubin 0.4 0.2 - 1.2 mg/dL   Alkaline Phosphatase 53 33 - 130 U/L   AST 16 10 - 35 U/L   ALT 19 6 - 29 U/L   Total Protein 6.6 6.1 - 8.1 g/dL   Albumin 4.3 3.6 - 5.1 g/dL   Calcium 8.7 8.6 - 10.4 mg/dL   GFR, Est African American 84 >=60 mL/min   GFR, Est Non African American 73 >=60 mL/min  Lipid Profile  Result Value Ref Range   Cholesterol 177 125 - 200 mg/dL   Triglycerides 121 <150 mg/dL   HDL 58 >=46 mg/dL   Total CHOL/HDL Ratio 3.1 <=5.0 Ratio   VLDL 24 <30 mg/dL   LDL Cholesterol 95 <130 mg/dL  CBC with Differential  Result Value Ref Range   WBC 6.9 3.8 - 10.8 K/uL   RBC 4.01 3.80 - 5.10 MIL/uL   Hemoglobin 12.5 11.7 - 15.5 g/dL   HCT 36.8 35.0 - 45.0 %   MCV 91.8 80.0 - 100.0 fL   MCH 31.2 27.0 - 33.0 pg   MCHC 34.0 32.0 - 36.0 g/dL   RDW 13.3 11.0 - 15.0 %   Platelets 249 140 - 400 K/uL   MPV 9.3 7.5 - 12.5 fL   Neutro Abs 3,795 1,500 - 7,800 cells/uL   Lymphs Abs 2,346 850 - 3,900 cells/uL   Monocytes Absolute 621 200 - 950 cells/uL   Eosinophils Absolute 138 15 - 500 cells/uL   Basophils Absolute 0 0 - 200 cells/uL   Neutrophils Relative % 55 %   Lymphocytes Relative 34 %   Monocytes Relative 9 %   Eosinophils Relative 2 %   Basophils Relative 0 %   Smear Review Criteria for review not met       Assessment & Plan:   Problem List Items Addressed This Visit    Insomnia    Acute on chronic problem, now worse with life stressor and anxiety/acute adjustment Treat underlying problem Start SSRI Escitalopram, see A&P Continue Wellbutrin XL 331m daily - cautioned this may worsen insomnia - may discuss change of med in future, titrate down and switch to Remeron vs Trazodone if needed for better sleep. Now since former smoker, does not need wellbutrin for  smoking      Relevant Medications   escitalopram (LEXAPRO) 10 MG tablet   Acute adjustment disorder with anxiety - Primary    Suspected new acute adjustment disorder with life stressors with mixed anxiety and depression now with gradual  worsening causing more difficulty functioning, previously coped well, now affecting with worse poor sleep / worrying and family legal issue stress is primary stressor. Suspected insomnia is secondary to anxiety/mood. -GAD7: 16, somewhat difficult / PHQ9: 15 somewhat difficult - Currently on Wellbutrin. No other prior meds, no known SSRI therapy. - No prior Psych / counseling  Plan: 1. Discussion on adjustment disorder vs anxiety management, complications, likely contributing to insomnia 2. Start Escitalopram 43m daily AM with food, counseling on potential side effects risks, reviewed possible GI intolerance, insomnia (although likely to improve this given anxiety likely source of insomnia), sexual dysfunction, reviewed black box warning inc suicidal (no prior history, unlikely concern) - anticipate 4-6 weeks for notable effect, may need titrate dose to 20 in future - Continue Wellbutrin XL 3068mdaily 3. Advised recommend therapy / counseling in future - explained treatment is both with medication and therapy, however she declined this option and does not want information on therapists. Advised she may not get as much benefit and given the nature of her problems and stressors she would benefit from this 4. Follow-up 4-6 weeks anxiety, med adjust, GAD7/PHQ9      Relevant Medications   escitalopram (LEXAPRO) 10 MG tablet      Meds ordered this encounter  Medications  .       . escitalopram (LEXAPRO) 10 MG tablet    Sig: Take 1 tablet (10 mg total) by mouth daily. If not improved by 3 weeks, may increase dose to 2 pills daily for 204m  Dispense:  30 tablet    Refill:  3      Follow up plan: Return in about 6 weeks (around 05/07/2017) for  Anxiety/Mood Adjustment / PHQ/GAD7.  AleNobie PutnamO [facility] 03/26/2017, 4:19 PM

## 2017-03-26 NOTE — Patient Instructions (Addendum)
Thank you for coming to the clinic today.  1. As discussed, it sounds like your symptoms are primarily related to anxiety / adjustment disorder. This is a very common problem and be related to several factors, including life stressors. Start treatment with Escitalopram 10mg , take one tablet daily for next 4-6 weeks. As discussed most anxiety medications are also used for mood disorders such as depression, because they work on similar chemicals in your brain. It may take up to 3-4 weeks for the medicine to take full effect and for you to notice a difference, sometimes you may notice it working sooner, otherwise we may need to adjust the dose.  For most patients with anxiety or mood concerns, we generally recommend referral to establish with a therapist or counselor as well. This has been shown to improve the effectiveness of the medications, and in the future we may be able to taper off medications.   Continue Wellbutrin XL 300mg  daily for now, in future we can consider switch to other medicine for possible helping sleep more.  Please schedule a Follow-up Appointment to: Return in about 6 weeks (around 05/07/2017) for Anxiety/Mood Adjustment / PHQ/GAD7.  If you have any other questions or concerns, please feel free to call the clinic or send a message through [place]. You may also schedule an earlier appointment if necessary.  Additionally, you may be receiving a survey about your experience at our clinic within a few days to 1 week by e-mail or mail. We value your feedback.  Nobie Putnam, DO [facility]

## 2017-04-24 ENCOUNTER — Other Ambulatory Visit: Payer: Self-pay | Admitting: Family Medicine

## 2017-04-24 DIAGNOSIS — M810 Age-related osteoporosis without current pathological fracture: Secondary | ICD-10-CM

## 2017-05-07 ENCOUNTER — Ambulatory Visit (INDEPENDENT_AMBULATORY_CARE_PROVIDER_SITE_OTHER): Payer: Federal, State, Local not specified - PPO | Admitting: Family Medicine

## 2017-05-07 ENCOUNTER — Encounter: Payer: Self-pay | Admitting: Family Medicine

## 2017-05-07 ENCOUNTER — Other Ambulatory Visit: Payer: Self-pay | Admitting: Family Medicine

## 2017-05-07 VITALS — BP 132/61 | HR 78 | Temp 98.7°F | Resp 16 | Ht 66.0 in | Wt 212.2 lb

## 2017-05-07 DIAGNOSIS — M810 Age-related osteoporosis without current pathological fracture: Secondary | ICD-10-CM | POA: Diagnosis not present

## 2017-05-07 DIAGNOSIS — Z0000 Encounter for general adult medical examination without abnormal findings: Secondary | ICD-10-CM

## 2017-05-07 DIAGNOSIS — F4322 Adjustment disorder with anxiety: Secondary | ICD-10-CM | POA: Diagnosis not present

## 2017-05-07 DIAGNOSIS — E782 Mixed hyperlipidemia: Secondary | ICD-10-CM

## 2017-05-07 DIAGNOSIS — F5101 Primary insomnia: Secondary | ICD-10-CM | POA: Diagnosis not present

## 2017-05-07 DIAGNOSIS — I10 Essential (primary) hypertension: Secondary | ICD-10-CM

## 2017-05-07 MED ORDER — ALENDRONATE SODIUM 70 MG PO TABS: 70.0000 mg | ORAL_TABLET | ORAL | 3 refills | Status: DC

## 2017-05-07 MED ORDER — ESCITALOPRAM OXALATE 10 MG PO TABS: 10.0000 mg | ORAL_TABLET | Freq: Every day | ORAL | 5 refills | Status: DC

## 2017-05-07 NOTE — Assessment & Plan Note (Signed)
Stable without complication or fracture Last DEXA 2016 with osteoporosis T score -2.5 On Alendronate 70mg  weekly, oral bisphosphonate since 2016, tolerating well - Refilled Alendronate today #12 pills 90 day supply +3 refills - Recommend to continue bisphosphonate for up to possible max 5 years then drug holiday - Next due DEXA anytime after 2 yrs, she defers until next year, anticipate 01/2018 or sooner, will need order

## 2017-05-07 NOTE — Progress Notes (Signed)
Subjective:    Patient ID: Monica Chapman, female    DOB: 1951/09/13, 66 y.o.   MRN: [id number]  Monica Chapman is a 65 y.o. female presenting on 05/07/2017 for Anxiety (improved)   HPI   FOLLOW-UP ADJUSTMENT DISORDER with Anxiety / Depesion / Chronic Insomnia - Last visit with me 03/26/17, for initial visit for same problem adjustment / anxiety / depression, treated with new med Escitalopram 41m daily, and continued Wellbutrin XL 3014m see prior notes for background information. - Interval update with 6 weeks now tolerating Lexapro well, did not need to increase dose up to 2060m Today patient reports overall seems improved on new SSRI. Would like to continue current doses of meds. Seems significantly improved but now has some new life stressors that are "out of her control", but she seems to be coping with them better than previously, not affecting her as much. - Was on Wellbutrin in past to help quit smoking, now >1 year - Admits insomnia is significantly improved now able to fall asleep well - Never seen Psychiatry, Psychology or therapist in past, she is not interested in this option - See PHQ/GAD for symptoms below - Denies any suicidal or homicidal ideation  OSTEOPOROSIS: - Prior history OP diagnosed on DEXA scan screening in 10/2014, had T Score -2.5, see result below, then started on Alendronate [id number] for bisphosphonate therapy, has been taking this regularly without problem for past >2 years, unsure when to get DEXA again. Has prior history of foot fracture otherwise no recent fractures or problems - Denies GERD or esophageal problems, abdominal pain, fall, fracture   GAD 7 : Generalized Anxiety Score 05/07/2017 03/26/2017  Nervous, Anxious, on Edge 2 3  Control/stop worrying 1 3  Worry too much - different things 2 2  Trouble relaxing 2 2  Restless 1 2  Easily annoyed or irritable 0 1  Afraid - awful might happen 0 3  Total GAD 7 Score 8 16  Anxiety Difficulty Not  difficult at all Somewhat difficult    Depression screen PHQ [facility] 05/07/2017 03/26/2017 12/03/2016  Decreased Interest _0 Down, Depressed, Hopeless _1 PHQ - 2 Score _2 Altered sleeping 0 3 0  Tired, decreased energy 0 2 0  Change in appetite 1 3 0  Feeling bad or failure about yourself  0 0 0  Trouble concentrating 0 1 1  Moving slowly or fidgety/restless 0 1 0  Suicidal thoughts 0 0 0  PHQ-9 Score _3 Difficult doing work/chores Not difficult at all - Not difficult at all    Social History  Substance Use Topics  . Smoking status: Former Smoker    Packs/day: 0.25    Types: Cigarettes    Quit date: 10/28/2015  . Smokeless tobacco: Former UserSystems developerAlcohol use 0.0 oz/week    Review of Systems Per HPI unless specifically indicated above     Objective:    BP 132/61   Pulse 78   Temp 98.7 F (37.1 C) (Oral)   Resp 16   Ht 5' 6" (1.676 m)   Wt 212 lb 3.2 oz (96.3 kg)   BMI 34.25 kg/m   Wt Readings from Last 3 Encounters:  05/07/17 212 lb 3.2 oz (96.3 kg)  03/26/17 215 lb 12.8 oz (97.9 kg)  12/03/16 228 lb (103.4 kg)    Physical Exam  Constitutional: She is oriented to person, place, and time. She appears  well-developed and well-nourished. No distress.  Well-appearing, comfortable, cooperative, overweight  HENT:  Head: Normocephalic and atraumatic.  Cardiovascular: Normal rate.   Pulmonary/Chest: Effort normal.  Musculoskeletal: She exhibits no edema.  Neurological: She is alert and oriented to person, place, and time.  Skin: Skin is warm and dry. No rash noted. She is not diaphoretic. No erythema.  Psychiatric: Her behavior is normal.  Well groomed, good eye contact, normal speech and thoughts. Improved mood, no further crying spells or tearing. Able to discuss concerns without emotional lability. Does not appear anxious.  Nursing note and vitals reviewed.  I have personally reviewed the radiology report from DEXA on 10/27/14.  EXAM:  DUAL X-RAY  ABSORPTIOMETRY (DXA) FOR BONE MINERAL DENSITY   IMPRESSION:  Your patient Monica Chapman completed a BMD test on 10/27/2014 using  the [place] (analysis version: 14.10) manufactured by  [employer]. The following summarizes the results of our  evaluation.   PATIENT BIOGRAPHICAL:  Name: Monica Chapman  Patient ID: [id number] Birth Date: [birth year] Height: 63.0 in.  Gender: Female Exam Date: 10/27/2014 Weight: 222.0 lbs.  Indications: Caucasian, Early Menopause, Height Loss, History of  Fracture (Adult), Hysterectomy, Oopherectomy Bilateral,  Postmenopausal Fractures: Left elbow, Left foot, Right elbow  Treatments: aspirin, CALCIUM VIT D   ASSESSMENT:   The BMD measured at Forearm Radius 33% is 0.655 g/cm2 with a T-score  of -2.5. This patient is considered osteoporotic according to [facility]) criteria. Lumbar spine was not utilized  due to advanced degenerative changes.   Site Region Measured Measured WHO Young Adult BMD  Date       Age      Classification T-score  DualFemur Total Left 10/27/2014 61.7 Osteopenia -1.1 0.867 g/cm2   Right Forearm Radius 33% 10/27/2014 61.7 Osteoporosis -2.5 0.655  g/cm2   World Health Organization [facility]) criteria for post-menopausal,  Caucasian Women:  Normal:       T-score at or above -1 SD  Osteopenia:   T-score between -1 and -2.5 SD  Osteoporosis: T-score at or below -2.5 SD   RECOMMENDATIONS:  [place] recommends that FDA-approved  medical therapies be considered in postmenopausal women and men age  37 or older with a:  1. Hip or vertebral (clinical or morphometric) fracture.  2. T-score of < -2.5 at the spine or hip.  3. Ten-year fracture probability by FRAX of 3% or greater for hip  fracture or 20% or greater for major osteoporotic fracture.   All treatment decisions require clinical judgment and consideration  of individual patient factors, including patient  preferences,  co-morbidities, previous drug use, risk factors not captured in the  FRAX model (e.g. falls, vitamin D deficiency, increased bone  turnover, interval significant decline in bone density) and possible  under - or over-estimation of fracture risk by FRAX.   All patients should ensure an adequate intake of dietary calcium  (1200 mg/d) and vitamin D (800 IU daily) unless contraindicated.   FOLLOW-UP:  People with diagnosed cases of osteoporosis or at high risk for  fracture should have regular bone mineral density tests. For  patients eligible for Medicare, routine testing is allowed once  every 2 years. The testing frequency can be increased to one year  for patients who have rapidly progressing disease, those who are  receiving or discontinuing medical therapy to restore bone mass, or  have additional risk factors.   I have reviewed this report, and agree with the above findings.  [facility]    Electronically Signed    By: Lowella Grip M.D.    On: 10/27/2014 13:37    Results for orders placed or performed in visit on 04/24/16  COMPLETE METABOLIC PANEL WITH GFR  Result Value Ref Range   Sodium 139 135 - 146 mmol/L   Potassium 4.8 3.5 - 5.3 mmol/L   Chloride 106 98 - 110 mmol/L   CO2 24 20 - 31 mmol/L   Glucose, Bld 95 65 - 99 mg/dL   BUN 24 7 - 25 mg/dL   Creat 0.85 0.50 - 0.99 mg/dL   Total Bilirubin 0.4 0.2 - 1.2 mg/dL   Alkaline Phosphatase 53 33 - 130 U/L   AST 16 10 - 35 U/L   ALT 19 6 - 29 U/L   Total Protein 6.6 6.1 - 8.1 g/dL   Albumin 4.3 3.6 - 5.1 g/dL   Calcium 8.7 8.6 - 10.4 mg/dL   GFR, Est African American 84 >=60 mL/min   GFR, Est Non African American 73 >=60 mL/min  Lipid Profile  Result Value Ref Range   Cholesterol 177 125 - 200 mg/dL   Triglycerides 121 <150 mg/dL   HDL 58 >=46 mg/dL   Total CHOL/HDL Ratio 3.1 <=5.0 Ratio   VLDL 24 <30 mg/dL   LDL Cholesterol 95 <130 mg/dL  CBC with Differential  Result Value  Ref Range   WBC 6.9 3.8 - 10.8 K/uL   RBC 4.01 3.80 - 5.10 MIL/uL   Hemoglobin 12.5 11.7 - 15.5 g/dL   HCT 36.8 35.0 - 45.0 %   MCV 91.8 80.0 - 100.0 fL   MCH 31.2 27.0 - 33.0 pg   MCHC 34.0 32.0 - 36.0 g/dL   RDW 13.3 11.0 - 15.0 %   Platelets 249 140 - 400 K/uL   MPV 9.3 7.5 - 12.5 fL   Neutro Abs 3,795 1,500 - 7,800 cells/uL   Lymphs Abs 2,346 850 - 3,900 cells/uL   Monocytes Absolute 621 200 - 950 cells/uL   Eosinophils Absolute 138 15 - 500 cells/uL   Basophils Absolute 0 0 - 200 cells/uL   Neutrophils Relative % 55 %   Lymphocytes Relative 34 %   Monocytes Relative 9 %   Eosinophils Relative 2 %   Basophils Relative 0 %   Smear Review Criteria for review not met       Assessment & Plan:   Problem List Items Addressed This Visit    Persistent adjustment disorder with anxiety - Primary    Significant improvement in interval on new SSRI Lexapro, did not increase dose - Coping improved. Insomnia improved. Family legal issue stress is primary stressor. -GAD7: 16 > 8 (not difficult) / PHQ9: 15 > 3 (not difficult) - No prior Psych / counseling  Plan: 1. Refilled Escitalopram 35m daily - hold dose increase for now 2. Continue Wellbutrin XL 3024mdaily - discussion on possible taper down vs switch to alternative med if need, defer for now since improving 3. Advised recommend therapy / counseling in future - declined again today 4. Follow-up 4 months annual physical / PHQ/GAD f/u      Relevant Medications   escitalopram (LEXAPRO) 10 MG tablet   Osteoporosis    Stable without complication or fracture Last DEXA 2016 with osteoporosis T score -2.5 On Alendronate 7062meekly, oral bisphosphonate since 2016, tolerating well - Refilled Alendronate today #12 pills 90 day supply +3 refills - Recommend to continue bisphosphonate for up to possible max 5  years then drug holiday - Next due DEXA anytime after 2 yrs, she defers until next year, anticipate 01/2018 or sooner, will need  order      Relevant Medications   alendronate (FOSAMAX) 70 MG tablet   Insomnia    Stable and improved now, seems well controlled on Lexapro 70m daily Discussed option to consider switch Wellbutrin to other med for better insomnia coverage such as mirtazapine / trazodone but will hold for now      Relevant Medications   escitalopram (LEXAPRO) 10 MG tablet      Current Outpatient Prescriptions:  .  alendronate (FOSAMAX) 70 MG tablet, Take 1 tablet (70 mg total) by mouth once a week. Take with a full glass of water on an empty stomach., Disp: 12 tablet, Rfl: 3 .  atorvastatin (LIPITOR) 20 MG tablet, Take 0.5 tablets (10 mg total) by mouth daily at 6 PM., Disp: 30 tablet, Rfl: 2 .  buPROPion (WELLBUTRIN XL) 300 MG 24 hr tablet, Take 1 tablet (300 mg total) by mouth daily., Disp: 30 tablet, Rfl: 5 .  calcium-vitamin D (OSCAL WITH D) 500-200 MG-UNIT per tablet, Take 1 tablet by mouth., Disp: , Rfl:  .  Cetirizine HCl 10 MG CAPS, Take 10 mg by mouth at bedtime. , Disp: , Rfl:  .  escitalopram (LEXAPRO) 10 MG tablet, Take 1 tablet (10 mg total) by mouth daily., Disp: 30 tablet, Rfl: 5 .  hydrochlorothiazide (HYDRODIURIL) 25 MG tablet, Take 1 tablet (25 mg total) by mouth daily., Disp: 30 tablet, Rfl: 6 .  Influenza Virus Vacc Split PF (AFLURIA PRESERVATIVE FREE) 0.5 ML SUSY, Afluria 2016-2017 (PF) 45 mcg(15 mcg x 3)/0.5 mL intramuscular syringe  inject 0.5 milliliter intramuscularly, Disp: , Rfl:  .  losartan (COZAAR) 100 MG tablet, take 1 tablet by mouth once daily, Disp: 90 tablet, Rfl: 3 .  Melatonin 3 MG TABS, Take by mouth as needed., Disp: , Rfl:  .  omeprazole (PRILOSEC) 20 MG capsule, take 1 capsule by mouth once daily, Disp: 30 capsule, Rfl: 12  Follow up plan: Return in about 4 months (around 09/06/2017) for Annual Physical.  ANobie Putnam DO [facility] 05/07/2017, 8:52 PM

## 2017-05-07 NOTE — Assessment & Plan Note (Signed)
Significant improvement in interval on new SSRI Lexapro, did not increase dose - Coping improved. Insomnia improved. Family legal issue stress is primary stressor. -GAD7: 16 > 8 (not difficult) / PHQ9: 15 > 3 (not difficult) - No prior Psych / counseling  Plan: 1. Refilled Escitalopram 10mg  daily - hold dose increase for now 2. Continue Wellbutrin XL 300mg  daily - discussion on possible taper down vs switch to alternative med if need, defer for now since improving 3. Advised recommend therapy / counseling in future - declined again today 4. Follow-up 4 months annual physical / PHQ/GAD f/u

## 2017-05-07 NOTE — Patient Instructions (Addendum)
Thank you for coming to the clinic today.  1. Keep up the good work 2. Refilled Escitalopram 10mg  daily - in future if ever feel like need to double dose, can go to 2 pills or 20mg  daily, may contact office if need new rx 3. Continue Wellbutrin XL 300mg  daily - if you feel like may be able to lower this dose, we can try this as well, or switch to alternative if some other problem arises, poor sleep again  DUE for FASTING BLOOD WORK (no food or drink after midnight before the lab appointment, only water or coffee without cream/sugar on the morning of)  SCHEDULE "Lab Only" visit in the morning at the clinic for lab draw in 4 MONTHS   - Make sure Lab Only appointment is at about 1 week before your next appointment, so that results will be available  For Lab Results, once available within 2-3 days of blood draw, you can can log in to MyChart online to view your results and a brief explanation. Also, we can discuss results at next follow-up visit.  Please schedule a Follow-up Appointment to: Return in about 4 months (around 09/06/2017) for Annual Physical.  If you have any other questions or concerns, please feel free to call the clinic or send a message through [place]. You may also schedule an earlier appointment if necessary.  Additionally, you may be receiving a survey about your experience at our clinic within a few days to 1 week by e-mail or mail. We value your feedback.  Nobie Putnam, DO [place]

## 2017-05-07 NOTE — Assessment & Plan Note (Signed)
Stable and improved now, seems well controlled on Lexapro 10mg  daily Discussed option to consider switch Wellbutrin to other med for better insomnia coverage such as mirtazapine / trazodone but will hold for now

## 2017-06-05 ENCOUNTER — Telehealth: Payer: Self-pay | Admitting: Family Medicine

## 2017-06-05 DIAGNOSIS — E782 Mixed hyperlipidemia: Secondary | ICD-10-CM

## 2017-06-05 MED ORDER — ATORVASTATIN CALCIUM 20 MG PO TABS: 10.0000 mg | ORAL_TABLET | Freq: Every day | ORAL | 3 refills | Status: DC

## 2017-06-05 NOTE — Telephone Encounter (Signed)
Pt needs a refill on atorvastatin sent to [facility] in [place]

## 2017-06-05 NOTE — Telephone Encounter (Signed)
Refilled

## 2017-09-04 ENCOUNTER — Other Ambulatory Visit: Payer: Federal, State, Local not specified - PPO

## 2017-09-05 ENCOUNTER — Encounter: Payer: Self-pay | Admitting: Family Medicine

## 2017-09-05 DIAGNOSIS — E559 Vitamin D deficiency, unspecified: Secondary | ICD-10-CM | POA: Insufficient documentation

## 2017-09-05 LAB — CBC WITH DIFFERENTIAL/PLATELET
BASOS PCT: 0.9 %
Basophils Absolute: 63 cells/uL (ref 0–200)
EOS ABS: 147 {cells}/uL (ref 15–500)
Eosinophils Relative: 2.1 %
HCT: 37.8 % (ref 35.0–45.0)
HEMOGLOBIN: 13.1 g/dL (ref 11.7–15.5)
Lymphs Abs: 2128 cells/uL (ref 850–3900)
MCH: 33.5 pg — AB (ref 27.0–33.0)
MCHC: 34.7 g/dL (ref 32.0–36.0)
MCV: 96.7 fL (ref 80.0–100.0)
MPV: 9.7 fL (ref 7.5–12.5)
Monocytes Relative: 7.7 %
NEUTROS ABS: 4123 {cells}/uL (ref 1500–7800)
Neutrophils Relative %: 58.9 %
PLATELETS: 284 10*3/uL (ref 140–400)
RBC: 3.91 10*6/uL (ref 3.80–5.10)
RDW: 12.7 % (ref 11.0–15.0)
TOTAL LYMPHOCYTE: 30.4 %
WBC: 7 10*3/uL (ref 3.8–10.8)
WBCMIX: 539 {cells}/uL (ref 200–950)

## 2017-09-05 LAB — COMPLETE METABOLIC PANEL WITH GFR
AG RATIO: 1.6 (calc) (ref 1.0–2.5)
ALBUMIN MSPROF: 4.2 g/dL (ref 3.6–5.1)
ALT: 25 U/L (ref 6–29)
AST: 25 U/L (ref 10–35)
Alkaline phosphatase (APISO): 56 U/L (ref 33–130)
BUN: 14 mg/dL (ref 7–25)
CALCIUM: 9.1 mg/dL (ref 8.6–10.4)
CO2: 27 mmol/L (ref 20–32)
Chloride: 104 mmol/L (ref 98–110)
Creat: 0.86 mg/dL (ref 0.50–0.99)
GFR, EST AFRICAN AMERICAN: 83 mL/min/{1.73_m2} (ref >=60)
GFR, EST NON AFRICAN AMERICAN: 71 mL/min/{1.73_m2} (ref >=60)
GLUCOSE: 95 mg/dL (ref 65–99)
Globulin: 2.6 g/dL (calc) (ref 1.9–3.7)
Potassium: 4.4 mmol/L (ref 3.5–5.3)
SODIUM: 138 mmol/L (ref 135–146)
TOTAL PROTEIN: 6.8 g/dL (ref 6.1–8.1)
Total Bilirubin: 0.6 mg/dL (ref 0.2–1.2)

## 2017-09-05 LAB — HEMOGLOBIN A1C
HEMOGLOBIN A1C: 5 %{Hb} (ref <5.7)
Mean Plasma Glucose: 97 (calc)
eAG (mmol/L): 5.4 (calc)

## 2017-09-05 LAB — LIPID PANEL
CHOLESTEROL: 191 mg/dL (ref <200)
HDL: 62 mg/dL (ref >50)
LDL Cholesterol (Calc): 101 mg/dL (calc) — ABNORMAL HIGH
Non-HDL Cholesterol (Calc): 129 mg/dL (calc) (ref <130)
Total CHOL/HDL Ratio: 3.1 (calc) (ref <5.0)
Triglycerides: 185 mg/dL — ABNORMAL HIGH (ref <150)

## 2017-09-05 LAB — TSH: TSH: 2.19 m[IU]/L (ref 0.40–4.50)

## 2017-09-05 LAB — VITAMIN D 25 HYDROXY (VIT D DEFICIENCY, FRACTURES): VIT D 25 HYDROXY: 10 ng/mL — AB (ref 30–100)

## 2017-09-10 ENCOUNTER — Encounter: Payer: Self-pay | Admitting: Family Medicine

## 2017-09-10 ENCOUNTER — Ambulatory Visit (INDEPENDENT_AMBULATORY_CARE_PROVIDER_SITE_OTHER): Payer: Federal, State, Local not specified - PPO | Admitting: Family Medicine

## 2017-09-10 VITALS — BP 126/56 | HR 62 | Temp 98.2°F | Resp 16 | Ht 66.0 in | Wt 203.0 lb

## 2017-09-10 DIAGNOSIS — F5101 Primary insomnia: Secondary | ICD-10-CM | POA: Diagnosis not present

## 2017-09-10 DIAGNOSIS — Z0000 Encounter for general adult medical examination without abnormal findings: Secondary | ICD-10-CM

## 2017-09-10 DIAGNOSIS — I10 Essential (primary) hypertension: Secondary | ICD-10-CM

## 2017-09-10 DIAGNOSIS — E782 Mixed hyperlipidemia: Secondary | ICD-10-CM

## 2017-09-10 DIAGNOSIS — F4322 Adjustment disorder with anxiety: Secondary | ICD-10-CM | POA: Diagnosis not present

## 2017-09-10 DIAGNOSIS — E559 Vitamin D deficiency, unspecified: Secondary | ICD-10-CM | POA: Diagnosis not present

## 2017-09-10 DIAGNOSIS — F411 Generalized anxiety disorder: Secondary | ICD-10-CM | POA: Diagnosis not present

## 2017-09-10 MED ORDER — LOSARTAN POTASSIUM 100 MG PO TABS: 100.0000 mg | ORAL_TABLET | Freq: Every day | ORAL | 11 refills | Status: DC

## 2017-09-10 MED ORDER — ESCITALOPRAM OXALATE 10 MG PO TABS: 10.0000 mg | ORAL_TABLET | Freq: Every day | ORAL | 11 refills | Status: DC

## 2017-09-10 MED ORDER — HYDROCHLOROTHIAZIDE 25 MG PO TABS: 25.0000 mg | ORAL_TABLET | Freq: Every day | ORAL | 11 refills | Status: DC

## 2017-09-10 MED ORDER — BUPROPION HCL ER (XL) 300 MG PO TB24: 300.0000 mg | ORAL_TABLET | Freq: Every day | ORAL | 11 refills | Status: DC

## 2017-09-10 NOTE — Progress Notes (Signed)
Subjective:    Patient ID: Monica Chapman, female    DOB: 1950/10/09, 66 y.o.   MRN: [id number]  Monica Chapman is a 66 y.o. female presenting on 09/10/2017 for Annual Exam   HPI   Here for Annual Physical and Lab Review  CHRONIC HTN: Reports no new concerns. BP outside office normal. Current Meds - HCTZ 25mg , Losartan 100mg    Reports good compliance, took meds today. Tolerating well, w/o complaints.  HYPERLIPIDEMIA: - Reports no concerns. Last lipid panel 08/2017, controlled  - Currently taking Atorvastatin 10mg  daily (Half of 20mg ), tolerating well without side effects or myalgias  FOLLOW-UP ADJUSTMENT DISORDER with Anxiety / Depesion / Chronic Insomnia No new concerns Continues Lexapro 10mg  (no dose inc) and Wellbutrin XL 300mg  daily - due for refills - Admits insomnia is significantly improved now able to fall asleep well - Never seen Psychiatry, Psychology or therapist in past, she is not interested in this option - See PHQ/GAD for symptoms below - Denies any suicidal or homicidal ideation  History of Osteoporosis - no further problem, no fracture  Vitamin D Deficiency - low 10 on last result, only on 500 iu daily  Health Maintenance: UTD Mammogram, last 01/01/17 UTD Colonoscopy last 03/18/14 UTD Flu, PNA vaccines UTD routine HIV and Hep C  Depression screen [facility] 2/9 09/10/2017 05/07/2017 03/26/2017  Decreased Interest 0 1 2  Down, Depressed, Hopeless 2 1 3   PHQ - 2 Score 2 2 5   Altered sleeping 0 0 3  Tired, decreased energy 0 0 2  Change in appetite 0 1 3  Feeling bad or failure about yourself  0 0 0  Trouble concentrating 0 0 1  Moving slowly or fidgety/restless 0 0 1  Suicidal thoughts 0 0 0  PHQ-9 Score 2 3 15   Difficult doing work/chores Not difficult at all Not difficult at all -   GAD 7 : Generalized Anxiety Score 05/07/2017 03/26/2017  Nervous, Anxious, on Edge 2 3  Control/stop worrying 1 3  Worry too much - different things 2 2  Trouble relaxing 2  2  Restless 1 2  Easily annoyed or irritable 0 1  Afraid - awful might happen 0 3  Total GAD 7 Score 8 16  Anxiety Difficulty Not difficult at all Somewhat difficult      Past Medical History:  Diagnosis Date  . Arthritis   . Hypertension    Past Surgical History:  Procedure Laterality Date  . ABDOMINAL HYSTERECTOMY    . FOOT SURGERY     Social History   Socioeconomic History  . Marital status: Married    Spouse name: Not on file  . Number of children: Not on file  . Years of education: Not on file  . Highest education level: Not on file  Social Needs  . Financial resource strain: Not on file  . Food insecurity - worry: Not on file  . Food insecurity - inability: Not on file  . Transportation needs - medical: Not on file  . Transportation needs - non-medical: Not on file  Occupational History  . Not on file  Tobacco Use  . Smoking status: Former Smoker    Packs/day: 0.25    Types: Cigarettes    Last attempt to quit: 10/28/2015    Years since quitting: 1.8  . Smokeless tobacco: Former Network engineer and Sexual Activity  . Alcohol use: Yes    Alcohol/week: 0.0 oz  . Drug use: No  . Sexual activity: Not  on file  Other Topics Concern  . Not on file  Social History Narrative  . Not on file   Family History  Problem Relation Age of Onset  . Breast cancer Mother 42  . Breast cancer Maternal Grandmother    Current Outpatient Medications on File Prior to Visit  Medication Sig  . alendronate (FOSAMAX) 70 MG tablet Take 1 tablet (70 mg total) by mouth once a week. Take with a full glass of water on an empty stomach.  Marland Kitchen atorvastatin (LIPITOR) 20 MG tablet Take 0.5 tablets (10 mg total) by mouth daily at 6 PM.  . calcium-vitamin D (OSCAL WITH D) 500-200 MG-UNIT per tablet Take 1 tablet by mouth.  . Cetirizine HCl 10 MG CAPS Take 10 mg by mouth at bedtime.   . Melatonin 3 MG TABS Take by mouth as needed.  Marland Kitchen omeprazole (PRILOSEC) 20 MG capsule take 1 capsule by mouth  once daily   No current facility-administered medications on file prior to visit.     Review of Systems  Constitutional: Negative for activity change, appetite change, chills, diaphoresis, fatigue and fever.  HENT: Negative for congestion, hearing loss and postnasal drip.   Eyes: Negative for visual disturbance.  Respiratory: Negative for cough, chest tightness, shortness of breath and wheezing.   Cardiovascular: Negative for chest pain, palpitations and leg swelling.  Gastrointestinal: Negative for abdominal pain, anal bleeding, blood in stool, constipation, diarrhea, nausea and vomiting.  Endocrine: Negative for cold intolerance and polyuria.  Genitourinary: Negative for decreased urine volume, dysuria, frequency, hematuria, urgency, vaginal bleeding, vaginal discharge and vaginal pain.  Musculoskeletal: Negative for arthralgias, back pain and neck pain.  Skin: Negative for rash.  Allergic/Immunologic: Negative for environmental allergies.  Neurological: Negative for dizziness, weakness, light-headedness, numbness and headaches.  Hematological: Negative for adenopathy.  Psychiatric/Behavioral: Negative for behavioral problems, dysphoric mood and sleep disturbance. The patient is not nervous/anxious.    Per HPI unless specifically indicated above     Objective:    BP (!) 126/56   Pulse 62   Temp 98.2 F (36.8 C) (Oral)   Resp 16   Ht 5\' 6"  (1.676 m)   Wt 203 lb (92.1 kg)   BMI 32.77 kg/m   Wt Readings from Last 3 Encounters:  09/10/17 203 lb (92.1 kg)  05/07/17 212 lb 3.2 oz (96.3 kg)  03/26/17 215 lb 12.8 oz (97.9 kg)    Physical Exam  Constitutional: She is oriented to person, place, and time. She appears well-developed and well-nourished. No distress.  Well-appearing, comfortable, cooperative  HENT:  Head: Normocephalic and atraumatic.  Mouth/Throat: Oropharynx is clear and moist.  Frontal / maxillary sinuses non-tender. Nares patent without purulence or edema.  Bilateral TMs clear without erythema, effusion or bulging. Oropharynx clear without erythema, exudates, edema or asymmetry.  Eyes: Conjunctivae and EOM are normal. Pupils are equal, round, and reactive to light. Right eye exhibits no discharge. Left eye exhibits no discharge.  Neck: Normal range of motion. Neck supple. No thyromegaly present.  Cardiovascular: Normal rate, regular rhythm, normal heart sounds and intact distal pulses.  No murmur heard. Pulmonary/Chest: Effort normal and breath sounds normal. No respiratory distress. She has no wheezes. She has no rales.  Abdominal: Soft. Bowel sounds are normal. She exhibits no distension and no mass. There is no tenderness.  Musculoskeletal: Normal range of motion. She exhibits no edema or tenderness.  Upper / Lower Extremities: - Normal muscle tone, strength bilateral upper extremities 5/5, lower extremities 5/5  Lymphadenopathy:  She has no cervical adenopathy.  Neurological: She is alert and oriented to person, place, and time.  Distal sensation intact to light touch all extremities  Skin: Skin is warm and dry. No rash noted. She is not diaphoretic. No erythema.  Psychiatric: She has a normal mood and affect. Her behavior is normal.  Well groomed, good eye contact, normal speech and thoughts  Nursing note and vitals reviewed.  Results for orders placed or performed in visit on 09/04/17  TSH  Result Value Ref Range   TSH 2.19 0.40 - 4.50 mIU/L  VITAMIN D 25 Hydroxy (Vit-D Deficiency, Fractures)  Result Value Ref Range   Vit D, 25-Hydroxy 10 (L) 30 - 100 ng/mL  Hemoglobin A1c  Result Value Ref Range   Hgb A1c MFr Bld 5.0 <5.7 % of total Hgb   Mean Plasma Glucose 97 (calc)   eAG (mmol/L) 5.4 (calc)  Lipid panel  Result Value Ref Range   Cholesterol 191 <200 mg/dL   HDL 62 >50 mg/dL   Triglycerides 185 (H) <150 mg/dL   LDL Cholesterol (Calc) 101 (H) mg/dL (calc)   Total CHOL/HDL Ratio 3.1 <5.0 (calc)   Non-HDL Cholesterol (Calc)  129 <130 mg/dL (calc)  CBC with Differential/Platelet  Result Value Ref Range   WBC 7.0 3.8 - 10.8 Thousand/uL   RBC 3.91 3.80 - 5.10 Million/uL   Hemoglobin 13.1 11.7 - 15.5 g/dL   HCT 37.8 35.0 - 45.0 %   MCV 96.7 80.0 - 100.0 fL   MCH 33.5 (H) 27.0 - 33.0 pg   MCHC 34.7 32.0 - 36.0 g/dL   RDW 12.7 11.0 - 15.0 %   Platelets 284 140 - 400 Thousand/uL   MPV 9.7 7.5 - 12.5 fL   Neutro Abs 4,123 1,500 - 7,800 cells/uL   Lymphs Abs 2,128 850 - 3,900 cells/uL   WBC mixed population 539 200 - 950 cells/uL   Eosinophils Absolute 147 15 - 500 cells/uL   Basophils Absolute 63 0 - 200 cells/uL   Neutrophils Relative % 58.9 %   Total Lymphocyte 30.4 %   Monocytes Relative 7.7 %   Eosinophils Relative 2.1 %   Basophils Relative 0.9 %  COMPLETE METABOLIC PANEL WITH GFR  Result Value Ref Range   Glucose, Bld 95 65 - 99 mg/dL   BUN 14 7 - 25 mg/dL   Creat 0.86 0.50 - 0.99 mg/dL   GFR, Est Non African American 71 > OR = 60 mL/min/1.36m2   GFR, Est African American 83 > OR = 60 mL/min/1.70m2   BUN/Creatinine Ratio NOT APPLICABLE 6 - 22 (calc)   Sodium 138 135 - 146 mmol/L   Potassium 4.4 3.5 - 5.3 mmol/L   Chloride 104 98 - 110 mmol/L   CO2 27 20 - 32 mmol/L   Calcium 9.1 8.6 - 10.4 mg/dL   Total Protein 6.8 6.1 - 8.1 g/dL   Albumin 4.2 3.6 - 5.1 g/dL   Globulin 2.6 1.9 - 3.7 g/dL (calc)   AG Ratio 1.6 1.0 - 2.5 (calc)   Total Bilirubin 0.6 0.2 - 1.2 mg/dL   Alkaline phosphatase (APISO) 56 33 - 130 U/L   AST 25 10 - 35 U/L   ALT 25 6 - 29 U/L      Assessment & Plan:   Problem List Items Addressed This Visit    Essential hypertension    Well-controlled HTN - Home BP readings stable   No known complications   Plan:  1. Continue current BP  regimen Losartan 100mg , HCTZ 25mg  2. Encourage improved lifestyle - low sodium diet, regular exercise 3. Continue monitor BP outside office, bring readings to next visit, if persistently >140/90 or new symptoms notify office sooner 4.  Follow-up q 6 mo      Relevant Medications   losartan (COZAAR) 100 MG tablet   hydrochlorothiazide (HYDRODIURIL) 25 MG tablet   Hyperlipidemia    Controlled cholesterol on statin and lifestyle Last lipid panel 08/2017  Plan: 1. Continue current meds - Atorvastatin 10mg  (half of 20mg ) 2. Encourage improved lifestyle - low carb/cholesterol, reduce portion size, continue improving regular exercise      Relevant Medications   losartan (COZAAR) 100 MG tablet   hydrochlorothiazide (HYDRODIURIL) 25 MG tablet   Insomnia   Relevant Medications   escitalopram (LEXAPRO) 10 MG tablet   Persistent adjustment disorder with anxiety    Stable to improved Continues on Lexapro 10mg  and Wellbutrin XL 300mg  daily      Relevant Medications   escitalopram (LEXAPRO) 10 MG tablet   Vitamin D deficiency    Low vit D 10 Start Vitamin D3 5,000 iu daily x 12 weeks OTC, then reduce to 1-2k daily maintenance       Other Visit Diagnoses    Annual physical exam    -  Primary   Generalized anxiety disorder       Relevant Medications   escitalopram (LEXAPRO) 10 MG tablet   buPROPion (WELLBUTRIN XL) 300 MG 24 hr tablet      Meds ordered this encounter  Medications  . losartan (COZAAR) 100 MG tablet    Sig: Take 1 tablet (100 mg total) by mouth daily.    Dispense:  30 tablet    Refill:  11    If patient not due for refill, then please keep on file until requested by patient  . hydrochlorothiazide (HYDRODIURIL) 25 MG tablet    Sig: Take 1 tablet (25 mg total) by mouth daily.    Dispense:  30 tablet    Refill:  11    If patient not due for refill, then please keep on file until requested by patient  . escitalopram (LEXAPRO) 10 MG tablet    Sig: Take 1 tablet (10 mg total) by mouth daily.    Dispense:  30 tablet    Refill:  11    If patient not due for refill, then please keep on file until requested by patient  . buPROPion (WELLBUTRIN XL) 300 MG 24 hr tablet    Sig: Take 1 tablet (300 mg total)  by mouth daily.    Dispense:  30 tablet    Refill:  11    If patient not due for refill, then please keep on file until requested by patient    Follow up plan: Return in about 6 months (around 03/11/2018) for  Welcome to Medicare + EKG (with me).  Nobie Putnam, DO [facility] 09/11/2017, 1:12 AM

## 2017-09-10 NOTE — Patient Instructions (Addendum)
Thank you for coming to the clinic today.   1.  Vitamin D - Low at 10. Recommend start OTC Vitamin D3 5,000 iu daily for 12 weeks then reduce down to OTC Vitamin D3 1,000 or 2,000 iu daily for maintenance  Keep up the great work overall  Refilled meds as requested  Will be due for DEXA bone mineral density scan in June 2019 will order next time  Please schedule a Follow-up Appointment to: Return in about 6 months (around 03/11/2018) for  Welcome to Medicare + EKG (with me).    If you have any other questions or concerns, please feel free to call the clinic or send a message through [facility]. You may also schedule an earlier appointment if necessary.  Additionally, you may be receiving a survey about your experience at our clinic within a few days to 1 week by e-mail or mail. We value your feedback.  Nobie Putnam, DO [place]

## 2017-09-11 NOTE — Assessment & Plan Note (Signed)
Controlled cholesterol on statin and lifestyle Last lipid panel 08/2017  Plan: 1. Continue current meds - Atorvastatin 10mg  (half of 20mg ) 2. Encourage improved lifestyle - low carb/cholesterol, reduce portion size, continue improving regular exercise

## 2017-09-11 NOTE — Assessment & Plan Note (Signed)
Well-controlled HTN - Home BP readings stable   No known complications   Plan:  1. Continue current BP regimen Losartan 100mg , HCTZ 25mg  2. Encourage improved lifestyle - low sodium diet, regular exercise 3. Continue monitor BP outside office, bring readings to next visit, if persistently >140/90 or new symptoms notify office sooner 4. Follow-up q 6 mo

## 2017-09-11 NOTE — Assessment & Plan Note (Signed)
Low vit D 10 Start Vitamin D3 5,000 iu daily x 12 weeks OTC, then reduce to 1-2k daily maintenance

## 2017-09-11 NOTE — Assessment & Plan Note (Signed)
Stable to improved Continues on Lexapro 10mg  and Wellbutrin XL 300mg  daily

## 2017-12-05 ENCOUNTER — Other Ambulatory Visit: Payer: Self-pay | Admitting: Family Medicine

## 2017-12-05 DIAGNOSIS — Z1231 Encounter for screening mammogram for malignant neoplasm of breast: Secondary | ICD-10-CM

## 2018-01-02 ENCOUNTER — Ambulatory Visit
Admission: RE | Admit: 2018-01-02 | Discharge: 2018-01-02 | Disposition: A | Discharge status: Home / Self Care | Payer: Federal, State, Local not specified - PPO | Source: Ambulatory Visit | Attending: Family Medicine | Admitting: Family Medicine

## 2018-01-02 DIAGNOSIS — Z1231 Encounter for screening mammogram for malignant neoplasm of breast: Secondary | ICD-10-CM | POA: Diagnosis not present

## 2018-02-20 ENCOUNTER — Other Ambulatory Visit: Payer: Self-pay | Admitting: Family Medicine

## 2018-02-20 DIAGNOSIS — K2970 Gastritis, unspecified, without bleeding: Secondary | ICD-10-CM

## 2018-03-20 DIAGNOSIS — L509 Urticaria, unspecified: Secondary | ICD-10-CM | POA: Diagnosis not present

## 2018-03-20 DIAGNOSIS — L300 Nummular dermatitis: Secondary | ICD-10-CM | POA: Diagnosis not present

## 2018-06-27 ENCOUNTER — Other Ambulatory Visit: Payer: Self-pay | Admitting: Family Medicine

## 2018-06-27 DIAGNOSIS — M810 Age-related osteoporosis without current pathological fracture: Secondary | ICD-10-CM

## 2018-11-03 ENCOUNTER — Other Ambulatory Visit: Payer: Self-pay | Admitting: Family Medicine

## 2018-11-03 DIAGNOSIS — F5101 Primary insomnia: Secondary | ICD-10-CM

## 2018-11-03 DIAGNOSIS — I10 Essential (primary) hypertension: Secondary | ICD-10-CM

## 2018-11-03 DIAGNOSIS — F4322 Adjustment disorder with anxiety: Secondary | ICD-10-CM

## 2018-11-07 ENCOUNTER — Other Ambulatory Visit: Payer: Self-pay

## 2018-11-07 ENCOUNTER — Encounter: Payer: Self-pay | Admitting: Family Medicine

## 2018-11-07 ENCOUNTER — Ambulatory Visit (INDEPENDENT_AMBULATORY_CARE_PROVIDER_SITE_OTHER): Payer: Medicare Other | Admitting: Family Medicine

## 2018-11-07 VITALS — BP 136/47 | HR 65 | Temp 98.4°F | Resp 16 | Ht 66.0 in | Wt 189.0 lb

## 2018-11-07 DIAGNOSIS — Z23 Encounter for immunization: Secondary | ICD-10-CM

## 2018-11-07 DIAGNOSIS — R7309 Other abnormal glucose: Secondary | ICD-10-CM

## 2018-11-07 DIAGNOSIS — R634 Abnormal weight loss: Secondary | ICD-10-CM

## 2018-11-07 DIAGNOSIS — R630 Anorexia: Secondary | ICD-10-CM | POA: Diagnosis not present

## 2018-11-07 DIAGNOSIS — I10 Essential (primary) hypertension: Secondary | ICD-10-CM

## 2018-11-07 DIAGNOSIS — R252 Cramp and spasm: Secondary | ICD-10-CM | POA: Diagnosis not present

## 2018-11-07 DIAGNOSIS — R6881 Early satiety: Secondary | ICD-10-CM | POA: Diagnosis not present

## 2018-11-07 NOTE — Patient Instructions (Addendum)
Thank you for coming to the office today.  Labs at [facility] today, stay tuned for result. Call with results next week.   Leg cramps - Try spoonful of yellow mustard to relieve leg cramps or try daily to prevent the problem  - OTC natural option is Hyland's Leg Cramps (Dissolving tablet) take as needed for muscle cramps  ----------------------------------------------   Recommend to start taking Tylenol Extra Strength 500mg  tabs - take 1 to 2 tabs per dose (max 1000mg ) every 6-8 hours for pain (take regularly, don't skip a dose for next 7 days), max 24 hour daily dose is 6 tablets or 3000mg . In the future you can repeat the same everyday Tylenol course for 1-2 weeks at a time.    Sleep Hygiene Recommendations to promote healthy sleep in all patients, especially if symptoms of insomnia are worsening. Due to the nature of sleep rhythms, if your body gets "out of rhythm", it may take some time before your sleep cycle can be "reset".  Please try to follow as many of the following tips as you can, usually there are only a few of these are the primary cause of the problem.  ?To reset your sleep rhythm, go to bed and get up at the same time every day ?Sleep only long enough to feel rested and then get out of bed ?Do not try to force yourself to sleep. If you can't sleep, get out of bed and try again later. ?Avoid naps during the day, unless excessively tired. The more sleeping during the day, then the less sleep your body needs at night.  ?Have coffee, tea, and other foods that have caffeine only in the morning ?Exercise several days a week, but not right before bed ?If you drink alcohol, prefer to have appropriate drink with one meal, but prefer to avoid alcohol in the evening, and bedtime ?If you smoke, avoid smoking, especially in the evening  ?Avoid watching TV or looking at phones, computers, or reading devices ("e-books") that give off light at least 30 minutes before bed. This artificial  light sends "awake signals" to your brain and can make it harder to fall asleep. ?Make your bedroom a comfortable place where it is easy to fall asleep: ? Put up shades or special blackout curtains to block light from outside. ? Use a white noise machine to block noise. ? Keep the temperature cool. ?Try your best to solve or at least address your problems before you go to bed ?Use relaxation techniques to manage stress. Ask your health care provider to suggest some techniques that may work well for you. These may include: ? Breathing exercises. ? Routines to release muscle tension. ? Visualizing peaceful scenes.    Please schedule a Follow-up Appointment to: Return in about 6 months (around 05/08/2019) for Weight Check / Arthritis Hip.  If you have any other questions or concerns, please feel free to call the office or send a message through [facility]. You may also schedule an earlier appointment if necessary.  Additionally, you may be receiving a survey about your experience at our office within a few days to 1 week by e-mail or mail. We value your feedback.  Nobie Putnam, DO [facility]   Hip Exercises Ask your health care provider which exercises are safe for you. Do exercises exactly as told by your health care provider and adjust them as directed. It is normal to feel mild stretching, pulling, tightness, or discomfort as you do these exercises,  but you should stop right away if you feel sudden pain or your pain gets worse.Do not begin these exercises until told by your health care provider. Stretching and range of motion exercises These exercises warm up your muscles and joints and improve the movement and flexibility of your hip. These exercises also help to relieve pain, numbness, and tingling. Exercise A: Hamstrings, supine  1. Lie on your back. 2. Loop a belt or towel over the ball of your left / rightfoot. The ball of your foot is on the walking  surface, right under your toes. 3. Straighten your left / rightknee and slowly pull on the belt to raise your leg. ? Do not let your left / right knee bend while you do this. ? Keep your other leg flat on the floor. ? Raise the left / right leg until you feel a gentle stretch behind your left / right knee or thigh. 4. Hold this position for __________ seconds. 5. Slowly return your leg to the starting position. Repeat __________ times. Complete this stretch __________ times a day. Exercise B: Hip rotators  1. Lie on your back on a firm surface. 2. Hold your left / right knee with your left / right hand. Hold your ankle with your other hand. 3. Gently pull your left / right knee and rotate your lower leg toward your other shoulder. ? Pull until you feel a stretch in your buttocks. ? Keep your hips and shoulders firmly planted while you do this stretch. 4. Hold this position for __________ seconds. Repeat __________ times. Complete this stretch __________ times a day. Exercise C: V-sit (hamstrings and adductors)  1. Sit on the floor with your legs extended in a large "V" shape. Keep your knees straight during this exercise. 2. Start with your head and chest upright, then bend at your waist to reach for your left foot (position A). You should feel a stretch in your right inner thigh. 3. Hold this position for __________ seconds. Then slowly return to the upright position. 4. Bend at your waist to reach forward (position B). You should feel a stretch behind both of your thighs and knees. 5. Hold this position for __________ seconds. Then slowly return to the upright position. 6. Bend at your waist to reach for your right foot (position C). You should feel a stretch in your left inner thigh. 7. Hold this position for __________ seconds. Then slowly return to the upright position. Repeat __________ times. Complete this stretch __________ times a day. Exercise D: Lunge (hip flexors)  1. Place  your left / right knee on the floor and bend your other knee so that is directly over your ankle. You should be half-kneeling. 2. Keep good posture with your head over your shoulders. 3. Tighten your buttocks to point your tailbone downward. This helps your back to keep from arching too much. 4. You should feel a gentle stretch in the front of your left / right thigh and hip. If you do not feel any resistance, slightly slide your other foot forward and then slowly lunge forward so your knee once again lines up over your ankle. 5. Make sure your tailbone continues to point downward. 6. Hold this position for __________ seconds. Repeat __________ times. Complete this stretch __________ times a day. Strengthening exercises These exercises build strength and endurance in your hip. Endurance is the ability to use your muscles for a long time, even after they get tired. Exercise E: Bridge (hip extensors)  1. Lie on your back on a firm surface with your knees bent and your feet flat on the floor. 2. Tighten your buttocks muscles and lift your bottom off the floor until the trunk of your body is level with your thighs. ? Do not arch your back. ? You should feel the muscles working in your buttocks and the back of your thighs. If you do not feel these muscles, slide your feet 1-2 inches (2.5-5 cm) farther away from your buttocks. 3. Hold this position for __________ seconds. 4. Slowly lower your hips to the starting position. 5. Let your muscles relax completely between repetitions. 6. If this exercise is too easy, try doing it with your arms crossed over your chest. Repeat __________ times. Complete this exercise __________ times a day. Exercise F: Straight leg raises - hip abductors  1. Lie on your side with your left / right leg in the top position. Lie so your head, shoulder, knee, and hip line up with each other. You may bend your bottom knee to help you balance. 2. Roll your hips slightly  forward, so your hips are stacked directly over each other and your left / right knee is facing forward. 3. Leading with your heel, lift your top leg 4-6 inches (10-15 cm). You should feel the muscles in your outer hip lifting. ? Do not let your foot drift forward. ? Do not let your knee roll toward the ceiling. 4. Hold this position for __________ seconds. 5. Slowly return to the starting position. 6. Let your muscles relax completely between repetitions. Repeat __________ times. Complete this exercise __________ times a day. Exercise G: Straight leg raises - hip adductors  1. Lie on your side with your left / right leg in the bottom position. Lie so your head, shoulder, knee, and hip line up. You may place your upper foot in front to help you balance. 2. Roll your hips slightly forward, so your hips are stacked directly over each other and your left / right knee is facing forward. 3. Tense the muscles in your inner thigh and lift your bottom leg 4-6 inches (10-15 cm). 4. Hold this position for __________ seconds. 5. Slowly return to the starting position. 6. Let your muscles relax completely between repetitions. Repeat __________ times. Complete this exercise __________ times a day. Exercise H: Straight leg raises - quadriceps  1. Lie on your back with your left / right leg extended and your other knee bent. 2. Tense the muscles in the front of your left / right thigh. When you do this, you should see your kneecap slide up or see increased dimpling just above your knee. 3. Tighten these muscles even more and raise your leg 4-6 inches (10-15 cm) off the floor. 4. Hold this position for __________ seconds. 5. Keep these muscles tense as you lower your leg. 6. Relax the muscles slowly and completely between repetitions. Repeat __________ times. Complete this exercise __________ times a day. Exercise I: Hip abductors, standing 1. Tie one end of a rubber exercise band or tubing to a secure  surface, such as a table or pole. 2. Loop the other end of the band or tubing around your left / right ankle. 3. Keeping your ankle with the band or tubing directly opposite of the secured end, step away until there is tension in the tubing or band. Hold onto a chair as needed for balance. 4. Lift your left / right leg out to your side. While you do  this: ? Keep your back upright. ? Keep your shoulders over your hips. ? Keep your toes pointing forward. ? Make sure to use your hip muscles to lift your leg. Do not "throw" your leg or tip your body to lift your leg. 5. Hold this position for __________ seconds. 6. Slowly return to the starting position. Repeat __________ times. Complete this exercise __________ times a day. Exercise J: Squats (quadriceps) 1. Stand in a door frame so your feet and knees are in line with the frame. You may place your hands on the frame for balance. 2. Slowly bend your knees and lower your hips like you are going to sit in a chair. ? Keep your lower legs in a straight-up-and-down position. ? Do not let your hips go lower than your knees. ? Do not bend your knees lower than told by your health care provider. ? If your hip pain increases, do not bend as low. 3. Hold this position for ___________ seconds. 4. Slowly push with your legs to return to standing. Do not use your hands to pull yourself to standing. Repeat __________ times. Complete this exercise __________ times a day. This information is not intended to replace advice given to you by your health care provider. Make sure you discuss any questions you have with your health care provider. Document Released: 09/28/2005 Document Revised: 01/14/2018 Document Reviewed: 09/05/2015 Elsevier Interactive Patient Education  2019 [employer].

## 2018-11-07 NOTE — Progress Notes (Signed)
Subjective:    Patient ID: Monica Chapman, female    DOB: 1951/07/29, 68 y.o.   MRN: [id number]  Monica Chapman is a 68 y.o. female presenting on 11/07/2018 for Weight Check (weight lost and no appetite)  Last visit >1 year ago in 2018. Here to resume care.  HPI   Reduced Appetite / Weight Loss Reports that for past 1-2 weeks has had reduced appetite, less interested in some foods, no significant change to diet. She drinks a lot of water often to stay hydrated. She does not necessarily feel full early but just stops eating at times. No significant med change. Admits some early satiety uncertain if other symptoms associated with this - Denies any night sweat, nausea, vomiting, fevers, chills, cough, dyspnea  Arthritis, Hip - Right Chronic gradual worsening problem. Complains of stiffness and pain in R hip mostly. Prior x-rays years ago, none recently. She is active walking, has dog. Tries not to be sedentary Taking Tylenol Ext Str 500mg  x 2 = 1000mg  Denies any injury, other joint pain redness swelling, numbness or tingling  Muscle Cramping,  Reports intermittent chronic problem. Tries to stay hydrated mostly water. Tried a topical relief temporary only. Usually legs at night, related to sleep.  Additional update for Insomnia - previously addressed topic, she remains off medication for mood, she takes acetaminophen, benadryl, melatonin  Health Maintenance: Due for High dose Flu vaccine.  Depression screen [facility] 2/9 09/10/2017 05/07/2017 03/26/2017  Decreased Interest 0 1 2  Down, Depressed, Hopeless 2 1 3   PHQ - 2 Score 2 2 5   Altered sleeping 0 0 3  Tired, decreased energy 0 0 2  Change in appetite 0 1 3  Feeling bad or failure about yourself  0 0 0  Trouble concentrating 0 0 1  Moving slowly or fidgety/restless 0 0 1  Suicidal thoughts 0 0 0  PHQ-9 Score 2 3 15   Difficult doing work/chores Not difficult at all Not difficult at all -    Social History   Tobacco Use  .  Smoking status: Current Every Day Smoker    Packs/day: 0.25    Types: Cigarettes    Last attempt to quit: 10/28/2015    Years since quitting: 3.0  . Smokeless tobacco: Current User  Substance Use Topics  . Alcohol use: Yes    Alcohol/week: 0.0 standard drinks  . Drug use: No    Review of Systems Per HPI unless specifically indicated above     Objective:    BP (!) 136/47   Pulse 65   Temp 98.4 F (36.9 C) (Oral)   Resp 16   Ht 5\' 6"  (1.676 m)   Wt 189 lb (85.7 kg)   BMI 30.51 kg/m   Wt Readings from Last 3 Encounters:  11/07/18 189 lb (85.7 kg)  09/10/17 203 lb (92.1 kg)  05/07/17 212 lb 3.2 oz (96.3 kg)    Physical Exam Vitals signs and nursing note reviewed.  Constitutional:      General: She is not in acute distress.    Appearance: She is well-developed. She is not diaphoretic.     Comments: Well-appearing, comfortable, cooperative  HENT:     Head: Normocephalic and atraumatic.  Eyes:     General:        Right eye: No discharge.        Left eye: No discharge.     Conjunctiva/sclera: Conjunctivae normal.  Neck:     Musculoskeletal: Normal range of motion and  neck supple.     Thyroid: No thyromegaly.  Cardiovascular:     Rate and Rhythm: Normal rate and regular rhythm.     Heart sounds: Normal heart sounds. No murmur.  Pulmonary:     Effort: Pulmonary effort is normal. No respiratory distress.     Breath sounds: Normal breath sounds. No wheezing or rales.  Musculoskeletal:     Comments: Low Back / R HIp Inspection: BACK - Normal appearance, no spinal deformity, symmetrical. HIP - Normal appearance, symmetrical, no obvious leg length or pelvis deformity  Palpation: BACK - No tenderness over spinous processes. Bilateral lumbar paraspinal muscles non-tender and without hypertonicity/spasm. HIP - Mild tender to palpation deeper R greater trochanter region of lateral upper thigh. Lower extremity thigh calf soft non tender no spasm.  ROM: BACK - Full active  ROM forward flex / back extension, rotation L/R without discomfort HIP - Bilateral hip flex/ext supine normal, internal and external rotation normal without problem or limitation.  Special Testing: BACK - Seated SLR negative for radicular pain bilaterally HIP - Reduced FADIR internal rotation with some stiffness and sore.  Strength: Bilateral hip flex/ext 5/5, knee flex/ext 5/5, ankle dorsiflex/plantarflex 5/5 Neurovascular: intact distal sensation to light touch   Lymphadenopathy:     Cervical: No cervical adenopathy.  Skin:    General: Skin is warm and dry.     Findings: No erythema or rash.  Neurological:     Mental Status: She is alert and oriented to person, place, and time.  Psychiatric:        Behavior: Behavior normal.     Comments: Well groomed, good eye contact, normal speech and thoughts    Results for orders placed or performed in visit on 09/04/17  TSH  Result Value Ref Range   TSH 2.19 0.40 - 4.50 mIU/L  VITAMIN D 25 Hydroxy (Vit-D Deficiency, Fractures)  Result Value Ref Range   Vit D, 25-Hydroxy 10 (L) 30 - 100 ng/mL  Hemoglobin A1c  Result Value Ref Range   Hgb A1c MFr Bld 5.0 <5.7 % of total Hgb   Mean Plasma Glucose 97 (calc)   eAG (mmol/L) 5.4 (calc)  Lipid panel  Result Value Ref Range   Cholesterol 191 <200 mg/dL   HDL 62 >50 mg/dL   Triglycerides 185 (H) <150 mg/dL   LDL Cholesterol (Calc) 101 (H) mg/dL (calc)   Total CHOL/HDL Ratio 3.1 <5.0 (calc)   Non-HDL Cholesterol (Calc) 129 <130 mg/dL (calc)  CBC with Differential/Platelet  Result Value Ref Range   WBC 7.0 3.8 - 10.8 Thousand/uL   RBC 3.91 3.80 - 5.10 Million/uL   Hemoglobin 13.1 11.7 - 15.5 g/dL   HCT 37.8 35.0 - 45.0 %   MCV 96.7 80.0 - 100.0 fL   MCH 33.5 (H) 27.0 - 33.0 pg   MCHC 34.7 32.0 - 36.0 g/dL   RDW 12.7 11.0 - 15.0 %   Platelets 284 140 - 400 Thousand/uL   MPV 9.7 7.5 - 12.5 fL   Neutro Abs 4,123 1,500 - 7,800 cells/uL   Lymphs Abs 2,128 850 - 3,900 cells/uL   WBC  mixed population 539 200 - 950 cells/uL   Eosinophils Absolute 147 15 - 500 cells/uL   Basophils Absolute 63 0 - 200 cells/uL   Neutrophils Relative % 58.9 %   Total Lymphocyte 30.4 %   Monocytes Relative 7.7 %   Eosinophils Relative 2.1 %   Basophils Relative 0.9 %  COMPLETE METABOLIC PANEL WITH GFR  Result Value  Ref Range   Glucose, Bld 95 65 - 99 mg/dL   BUN 14 7 - 25 mg/dL   Creat 0.86 0.50 - 0.99 mg/dL   GFR, Est Non African American 71 > OR = 60 mL/min/1.37m2   GFR, Est African American 83 > OR = 60 mL/min/1.8m2   BUN/Creatinine Ratio NOT APPLICABLE 6 - 22 (calc)   Sodium 138 135 - 146 mmol/L   Potassium 4.4 3.5 - 5.3 mmol/L   Chloride 104 98 - 110 mmol/L   CO2 27 20 - 32 mmol/L   Calcium 9.1 8.6 - 10.4 mg/dL   Total Protein 6.8 6.1 - 8.1 g/dL   Albumin 4.2 3.6 - 5.1 g/dL   Globulin 2.6 1.9 - 3.7 g/dL (calc)   AG Ratio 1.6 1.0 - 2.5 (calc)   Total Bilirubin 0.6 0.2 - 1.2 mg/dL   Alkaline phosphatase (APISO) 56 33 - 130 U/L   AST 25 10 - 35 U/L   ALT 25 6 - 29 U/L      Assessment & Plan:   Problem List Items Addressed This Visit    Essential hypertension   Relevant Orders   Comprehensive metabolic panel   TSH    Other Visit Diagnoses    Poor appetite    -  Primary   Relevant Orders   Comprehensive metabolic panel   CBC with Differential/Platelet   TSH   Early satiety       Relevant Orders   Comprehensive metabolic panel   CBC with Differential/Platelet   TSH   Unexplained weight loss       Relevant Orders   Comprehensive metabolic panel   CBC with Differential/Platelet   TSH   Muscle cramping       Relevant Orders   Comprehensive metabolic panel   Magnesium   Abnormal glucose       Relevant Orders   Hemoglobin A1c   Needs flu shot       Relevant Orders   Flu vaccine HIGH DOSE PF (Completed)      Clinically she is well appearing without clear cause of her unexplained recent wt loss recently in past few weeks only, recent onset. No obvious  complicating factor or red flag GI symptoms, no other systemic symptoms as red flag for malignancy or other more serious cause of unexplained weight loss - Patient is a smoker, without any respiratory symptoms currently  Plan - Check lab work at [place] today fasting, will check chemistry, and thyroid panel and CBC for routine screening based on these symptoms, evaluate if any underlying medical or metabolic cause for her symptoms - Otherwise reassurance,a nd emphasis on improving her diet, higher protein / calorie count  ----------- Muscle cramping - Most likely nutritional - seems to be lower extremity worse positional leg muscles - Try home remedy mustard, OTC electrolyte replacement, given too much water hydration - Check chemistry K, Mag  -----------------------------  Hypertension Currently controlled No change to meds Check labs  -----  Hip Arthritis Subacute on chronic gradual worsening problem with R Hip pain Suspect underlying mild osteoarthritis as possible cause, without known injury or trauma. H/o OA in other joints. No prior hip problems or imaging. - No radiation of pain or radicular symptoms - Inadequate conservative therapy   Plan: 1. Increase Tylenol ext str 1k TID PRN - future consider muscle relaxant or stronger NSAID 2. Encouraged use of heating pad 1-2x daily for now then PRN. 3. Given handout for hip exercises and to modify  activities 4. Follow-up 4-6 weeks if not improving, consider hip x-rays, future troch bursa steroid injection as possibility may follow-up with Ortho if needed   No orders of the defined types were placed in this encounter.   Follow up plan: Return in about 6 months (around 05/08/2019) for Weight Check / Arthritis Hip.   Nobie Putnam, [facility] 11/07/2018, 9:40 AM

## 2018-11-08 LAB — CBC WITH DIFFERENTIAL/PLATELET
BASOS: 1 %
Basophils Absolute: 0 10*3/uL (ref 0.0–0.2)
EOS (ABSOLUTE): 0.1 10*3/uL (ref 0.0–0.4)
Eos: 1 %
Hematocrit: 40.6 % (ref 34.0–46.6)
Hemoglobin: 14.4 g/dL (ref 11.1–15.9)
IMMATURE GRANS (ABS): 0 10*3/uL (ref 0.0–0.1)
Immature Granulocytes: 0 %
Lymphocytes Absolute: 2.1 10*3/uL (ref 0.7–3.1)
Lymphs: 25 %
MCH: 34.2 pg — ABNORMAL HIGH (ref 26.6–33.0)
MCHC: 35.5 g/dL (ref 31.5–35.7)
MCV: 96 fL (ref 79–97)
Monocytes Absolute: 0.7 10*3/uL (ref 0.1–0.9)
Monocytes: 8 %
Neutrophils Absolute: 5.4 10*3/uL (ref 1.4–7.0)
Neutrophils: 65 %
Platelets: 254 10*3/uL (ref 150–450)
RBC: 4.21 x10E6/uL (ref 3.77–5.28)
RDW: 11.6 % — ABNORMAL LOW (ref 11.7–15.4)
WBC: 8.3 10*3/uL (ref 3.4–10.8)

## 2018-11-08 LAB — TSH: TSH: 1.3 u[IU]/mL (ref 0.450–4.500)

## 2018-11-08 LAB — COMPREHENSIVE METABOLIC PANEL
ALT: 29 IU/L (ref 0–32)
AST: 24 IU/L (ref 0–40)
Albumin/Globulin Ratio: 1.9 (ref 1.2–2.2)
Albumin: 4.9 g/dL — ABNORMAL HIGH (ref 3.8–4.8)
Alkaline Phosphatase: 68 IU/L (ref 39–117)
BUN/Creatinine Ratio: 28 (ref 12–28)
BUN: 27 mg/dL (ref 8–27)
Bilirubin Total: 0.3 mg/dL (ref 0.0–1.2)
CO2: 24 mmol/L (ref 20–29)
CREATININE: 0.98 mg/dL (ref 0.57–1.00)
Calcium: 10.1 mg/dL (ref 8.7–10.3)
Chloride: 97 mmol/L (ref 96–106)
GFR calc Af Amer: 70 mL/min/{1.73_m2} (ref >59)
GFR calc non Af Amer: 61 mL/min/{1.73_m2} (ref >59)
Globulin, Total: 2.6 g/dL (ref 1.5–4.5)
Glucose: 105 mg/dL — ABNORMAL HIGH (ref 65–99)
Potassium: 4.5 mmol/L (ref 3.5–5.2)
Sodium: 138 mmol/L (ref 134–144)
Total Protein: 7.5 g/dL (ref 6.0–8.5)

## 2018-11-08 LAB — MAGNESIUM: Magnesium: 1.9 mg/dL (ref 1.6–2.3)

## 2018-11-08 LAB — HEMOGLOBIN A1C
Est. average glucose Bld gHb Est-mCnc: 108 mg/dL
Hgb A1c MFr Bld: 5.4 % (ref 4.8–5.6)

## 2018-11-10 ENCOUNTER — Other Ambulatory Visit: Payer: Self-pay | Admitting: Family Medicine

## 2018-11-10 DIAGNOSIS — E782 Mixed hyperlipidemia: Secondary | ICD-10-CM

## 2018-11-10 DIAGNOSIS — I10 Essential (primary) hypertension: Secondary | ICD-10-CM

## 2018-11-10 MED ORDER — ATORVASTATIN CALCIUM 10 MG PO TABS: 10.0000 mg | ORAL_TABLET | Freq: Every day | ORAL | 1 refills | Status: DC

## 2019-01-05 ENCOUNTER — Other Ambulatory Visit: Payer: Self-pay | Admitting: Family Medicine

## 2019-01-05 DIAGNOSIS — F4322 Adjustment disorder with anxiety: Secondary | ICD-10-CM

## 2019-01-05 DIAGNOSIS — F5101 Primary insomnia: Secondary | ICD-10-CM

## 2019-01-05 DIAGNOSIS — I10 Essential (primary) hypertension: Secondary | ICD-10-CM

## 2019-02-09 ENCOUNTER — Other Ambulatory Visit: Payer: Self-pay | Admitting: Family Medicine

## 2019-02-09 DIAGNOSIS — Z1231 Encounter for screening mammogram for malignant neoplasm of breast: Secondary | ICD-10-CM

## 2019-02-20 ENCOUNTER — Encounter: Payer: Self-pay | Admitting: Family Medicine

## 2019-02-20 ENCOUNTER — Ambulatory Visit (INDEPENDENT_AMBULATORY_CARE_PROVIDER_SITE_OTHER): Payer: Medicare Other | Admitting: Family Medicine

## 2019-02-20 ENCOUNTER — Other Ambulatory Visit: Payer: Self-pay

## 2019-02-20 VITALS — BP 117/56 | HR 82 | Temp 98.5°F | Ht 66.0 in | Wt 168.2 lb

## 2019-02-20 DIAGNOSIS — R6881 Early satiety: Secondary | ICD-10-CM

## 2019-02-20 DIAGNOSIS — R634 Abnormal weight loss: Secondary | ICD-10-CM | POA: Diagnosis not present

## 2019-02-20 DIAGNOSIS — R630 Anorexia: Secondary | ICD-10-CM | POA: Diagnosis not present

## 2019-02-20 DIAGNOSIS — K589 Irritable bowel syndrome without diarrhea: Secondary | ICD-10-CM | POA: Diagnosis not present

## 2019-02-20 NOTE — Progress Notes (Signed)
Subjective:    Patient ID: Monica Chapman, female    DOB: October 31, 1950, 68 y.o.   MRN: [id number]  Monica Chapman is a 68 y.o. female presenting on 02/20/2019 for Weight Loss (pt lose 30lbs x 3 mth. Lose if appetite,bloating, diarrhea and belching x 3 mths )   HPI   Reduced Appetite / Early Satiety / Diarrhea / Unintentional Weight Loss - Last visit with me 11/07/18, for initial visit for same problem, at that time without GI symptoms but had some weight loss and poor appetite only, see prior notes for background information. - Interval update with continued persistent weight loss 30 lbs in 3 months, only eating smaller amounts feeling full early satiety - Today patient reports concerns with now worsening symptoms bloating, diarrhea, belching gas, upset stomach, early satiety, poor appetite, her weight loss is unintentional she reports - Diet - drinks mostly water. Not drinking carbonated drinks - History of hysterectomy due to endometriosis, age 65 - she had history of pelvic adhesions asking if this can cause problem - Taking Pepto PRN with some relief - Denies any night sweat, nausea, vomiting, fevers, chills, cough, dyspnea, abdominal pain, flank pain, dark stool blood in stool, abdominal cramping   Health Maintenance:  UTD Colonoscopy last 03/18/14 - no report available, she reported it was negative.    Depression screen [facility] 2/9 02/20/2019 09/10/2017 05/07/2017  Decreased Interest 0 0 1  Down, Depressed, Hopeless 0 2 1  PHQ - 2 Score 0 2 2  Altered sleeping 0 0 0  Tired, decreased energy 1 0 0  Change in appetite 3 0 1  Feeling bad or failure about yourself  0 0 0  Trouble concentrating 0 0 0  Moving slowly or fidgety/restless 0 0 0  Suicidal thoughts 0 0 0  PHQ-9 Score 4 2 3   Difficult doing work/chores Not difficult at all Not difficult at all Not difficult at all    Social History   Tobacco Use  . Smoking status: Current Every Day Smoker    Packs/day: 0.50   Types: Cigarettes    Last attempt to quit: 10/28/2015    Years since quitting: 3.3  . Smokeless tobacco: Never Used  Substance Use Topics  . Alcohol use: Yes    Alcohol/week: 3.0 standard drinks    Types: 3 Glasses of wine per week  . Drug use: No    Review of Systems Per HPI unless specifically indicated above     Objective:    BP (!) 117/56   Pulse 82   Temp 98.5 F (36.9 C) (Oral)   Ht 5\' 6"  (1.676 m)   Wt 168 lb 3.2 oz (76.3 kg)   BMI 27.15 kg/m   Wt Readings from Last 3 Encounters:  02/20/19 168 lb 3.2 oz (76.3 kg)  11/07/18 189 lb (85.7 kg)  09/10/17 203 lb (92.1 kg)    Physical Exam Vitals signs and nursing note reviewed.  Constitutional:      General: She is not in acute distress.    Appearance: She is well-developed. She is not diaphoretic.     Comments: Well-appearing, comfortable, cooperative  HENT:     Head: Normocephalic and atraumatic.  Eyes:     General:        Right eye: No discharge.        Left eye: No discharge.     Conjunctiva/sclera: Conjunctivae normal.  Cardiovascular:     Rate and Rhythm: Normal rate.  Pulmonary:  Effort: Pulmonary effort is normal.  Abdominal:     General: There is no distension.     Tenderness: There is no abdominal tenderness.  Skin:    General: Skin is warm and dry.     Findings: No erythema or rash.  Neurological:     Mental Status: She is alert and oriented to person, place, and time.  Psychiatric:        Behavior: Behavior normal.     Comments: Well groomed, good eye contact, normal speech and thoughts    Results for orders placed or performed in visit on 11/07/18  TSH  Result Value Ref Range   TSH 1.300 0.450 - 4.500 uIU/mL  Magnesium  Result Value Ref Range   Magnesium 1.9 1.6 - 2.3 mg/dL  CBC with Differential/Platelet  Result Value Ref Range   WBC 8.3 3.4 - 10.8 x10E3/uL   RBC 4.21 3.77 - 5.28 x10E6/uL   Hemoglobin 14.4 11.1 - 15.9 g/dL   Hematocrit 40.6 34.0 - 46.6 %   MCV 96 79 - 97 fL    MCH 34.2 (H) 26.6 - 33.0 pg   MCHC 35.5 31.5 - 35.7 g/dL   RDW 11.6 (L) 11.7 - 15.4 %   Platelets 254 150 - 450 x10E3/uL   Neutrophils 65 Not Estab. %   Lymphs 25 Not Estab. %   Monocytes 8 Not Estab. %   Eos 1 Not Estab. %   Basos 1 Not Estab. %   Neutrophils Absolute 5.4 1.4 - 7.0 x10E3/uL   Lymphocytes Absolute 2.1 0.7 - 3.1 x10E3/uL   Monocytes Absolute 0.7 0.1 - 0.9 x10E3/uL   EOS (ABSOLUTE) 0.1 0.0 - 0.4 x10E3/uL   Basophils Absolute 0.0 0.0 - 0.2 x10E3/uL   Immature Granulocytes 0 Not Estab. %   Immature Grans (Abs) 0.0 0.0 - 0.1 x10E3/uL  Hemoglobin A1c  Result Value Ref Range   Hgb A1c MFr Bld 5.4 4.8 - 5.6 %   Est. average glucose Bld gHb Est-mCnc 108 mg/dL  Comprehensive metabolic panel  Result Value Ref Range   Glucose 105 (H) 65 - 99 mg/dL   BUN 27 8 - 27 mg/dL   Creatinine, Ser 0.98 0.57 - 1.00 mg/dL   GFR calc non Af Amer 61 >59 mL/min/1.73   GFR calc Af Amer 70 >59 mL/min/1.73   BUN/Creatinine Ratio 28 12 - 28   Sodium 138 134 - 144 mmol/L   Potassium 4.5 3.5 - 5.2 mmol/L   Chloride 97 96 - 106 mmol/L   CO2 24 20 - 29 mmol/L   Calcium 10.1 8.7 - 10.3 mg/dL   Total Protein 7.5 6.0 - 8.5 g/dL   Albumin 4.9 (H) 3.8 - 4.8 g/dL   Globulin, Total 2.6 1.5 - 4.5 g/dL   Albumin/Globulin Ratio 1.9 1.2 - 2.2   Bilirubin Total 0.3 0.0 - 1.2 mg/dL   Alkaline Phosphatase 68 39 - 117 IU/L   AST 24 0 - 40 IU/L   ALT 29 0 - 32 IU/L      Assessment & Plan:   Problem List Items Addressed This Visit    IBS (irritable bowel syndrome)   Relevant Orders   Ambulatory referral to Gastroenterology    Other Visit Diagnoses    Unexplained weight loss    -  Primary   Relevant Orders   Ambulatory referral to Gastroenterology   Early satiety       Relevant Orders   Ambulatory referral to Gastroenterology   Poor appetite  Relevant Orders   Ambulatory referral to Gastroenterology      Clinically progressive unintentional weight loss 189 lbs to 168 lbs in 3 months  with constellation of GI symptoms, some functional and some red flag symptoms with early satiety - She has a history of IBS but seems significant worsening symptoms now  No clear etiology determined last visit with chemistry and lab work-up and improving nutrition as advised, limited results  Patient request further testing as previously discussed and opinion of GI specialist  Referral to [facility] for persistent worsening unintentional weight loss >30 lbs in 3 months, constellation of GI red flag symptoms with early satiety, functional dyspepsia, abdominal bloating and cramping, diarrhea. Last colonoscopy 2015 reported normal, do not have copy of report. Never had EGD in past. Requesting further work-up with most likely colonoscopy and endoscopy and or other abdominal imaging.    No orders of the defined types were placed in this encounter.  Orders Placed This Encounter  Procedures  . Ambulatory referral to Gastroenterology    Referral Priority:   Routine    Referral Type:   Consultation    Referral Reason:   Specialty Services Required    Number of Visits Requested:   1    Follow up plan: Return in about 3 months (around 05/23/2019), or if symptoms worsen or fail to improve, for GI symptoms weight loss.   Nobie Putnam, [facility] 02/20/2019, 10:05 AM

## 2019-02-20 NOTE — Patient Instructions (Addendum)
Thank you for coming to the office today.  Referral sent today to GI specialist - [place] - stay tuned for appointment.  [facility]) [address] [place], [place] [address] Phone: [phone]  Please schedule a Follow-up Appointment to: Return in about 3 months (around 05/23/2019), or if symptoms worsen or fail to improve, for GI symptoms weight loss.  If you have any other questions or concerns, please feel free to call the office or send a message through [facility]. You may also schedule an earlier appointment if necessary.  Additionally, you may be receiving a survey about your experience at our office within a few days to 1 week by e-mail or mail. We value your feedback.  Nobie Putnam, DO [place]

## 2019-03-03 ENCOUNTER — Other Ambulatory Visit: Payer: Self-pay

## 2019-03-03 ENCOUNTER — Encounter: Payer: Self-pay | Admitting: Gastroenterology

## 2019-03-03 ENCOUNTER — Ambulatory Visit (INDEPENDENT_AMBULATORY_CARE_PROVIDER_SITE_OTHER): Payer: Medicare Other | Admitting: Gastroenterology

## 2019-03-03 VITALS — BP 93/60 | HR 89 | Temp 98.9°F | Ht 66.0 in | Wt 166.4 lb

## 2019-03-03 DIAGNOSIS — Z86010 Personal history of colonic polyps: Secondary | ICD-10-CM

## 2019-03-03 DIAGNOSIS — R634 Abnormal weight loss: Secondary | ICD-10-CM | POA: Diagnosis not present

## 2019-03-03 MED ORDER — FAMOTIDINE 20 MG PO TABS: 20.0000 mg | ORAL_TABLET | Freq: Every day | ORAL | 1 refills | Status: DC

## 2019-03-03 NOTE — Addendum Note (Signed)
Addended by: Earl Lagos on: 03/03/2019 04:49 PM   Modules accepted: Orders, SmartSet

## 2019-03-03 NOTE — Addendum Note (Signed)
Addended by: Earl Lagos on: 03/03/2019 03:38 PM   Modules accepted: Orders

## 2019-03-03 NOTE — Progress Notes (Signed)
Monica Chapman [address]  [place]  [place], [place] [address]  Main: [phone]  Fax: [phone]   Gastroenterology Consultation  Referring Provider:     Nobie Putnam * Primary Care Physician:  Olin Hauser, DO Reason for Consultation:     Weight loss        HPI:    Chief Complaint  Patient presents with  . New Patient (Initial Visit)    unexplained wt. loss, early satiety, poor appetite, IBS    Monica Chapman is a 68 y.o. y/o female referred for consultation & management  by Dr. Parks Ranger, Devonne Doughty, DO.  Patient reports 2-year history of loss of appetite and reports 30 pound weight loss in the last 6 months.  Reports early satiety, and abdominal and discomfort with or without eating.  No blood in stool.  Reports to relentless bowel movements in the mornings after drinking coffee but no further bowel movements during the day.  No family history of colon cancer.  No dysphagia or heartburn.  No prior upper endoscopy  2010 colonoscopy by Dr. Candace Cruise, multilobulated sigmoid colon polyp removed.  2 diminutive rectal polyps removed.  Diverticulosis reported.  Pathology report not available.  2018 colonoscopy by Dr. Allen Norris, for polyp surveillance, with 8 subcentimeter polyps removed.  Diverticulosis and hemorrhoids reported.  Pathology report not available.   Past Medical History:  Diagnosis Date  . Arthritis   . Hypertension     Past Surgical History:  Procedure Laterality Date  . ABDOMINAL HYSTERECTOMY    . FOOT SURGERY      Prior to Admission medications   Medication Sig Start Date End Date Taking? Authorizing Provider  alendronate (FOSAMAX) 70 MG tablet TAKE 1 TABLET(70 MG) BY MOUTH 1 TIME A WEEK WITH A FULL GLASS OF WATER AND ON AN EMPTY STOMACH 06/27/18  Yes Karamalegos, Devonne Doughty, DO  atorvastatin (LIPITOR) 10 MG tablet Take 1 tablet (10 mg total) by mouth daily. 11/10/18  Yes Karamalegos, Devonne Doughty, DO  calcium-vitamin D  (OSCAL WITH D) 500-200 MG-UNIT per tablet Take 1 tablet by mouth.   Yes [provider]  Cetirizine HCl 10 MG CAPS Take 10 mg by mouth at bedtime.    Yes [provider]  escitalopram (LEXAPRO) 10 MG tablet TAKE 1 TABLET(10 MG) BY MOUTH DAILY 01/05/19  Yes Karamalegos, Devonne Doughty, DO  hydrochlorothiazide (HYDRODIURIL) 25 MG tablet TAKE 1 TABLET BY MOUTH DAILY 11/10/18  Yes Karamalegos, Devonne Doughty, DO  losartan (COZAAR) 100 MG tablet TAKE 1 TABLET BY MOUTH DAILY 01/05/19  Yes Karamalegos, Devonne Doughty, DO  Melatonin 3 MG TABS Take by mouth as needed.   Yes [provider]  omeprazole (PRILOSEC) 20 MG capsule TAKE ONE CAPSULE BY MOUTH ONCE DAILY 02/20/18  Yes Karamalegos, Devonne Doughty, DO    Family History  Problem Relation Age of Onset  . Breast cancer Mother 30  . Breast cancer Maternal Grandmother      Social History   Tobacco Use  . Smoking status: Current Every Day Smoker    Packs/day: 0.50    Types: Cigarettes    Last attempt to quit: 10/28/2015    Years since quitting: 3.3  . Smokeless tobacco: Never Used  Substance Use Topics  . Alcohol use: Yes    Alcohol/week: 3.0 standard drinks    Types: 3 Glasses of wine per week  . Drug use: No    Allergies as of 03/03/2019 - Review Complete 03/03/2019  Allergen Reaction Noted  .  Penicillins Itching 07/21/2014    Review of Systems:    All systems reviewed and negative except where noted in HPI.   Physical Exam:  BP 93/60   Pulse 89   Temp 98.9 F (37.2 C) (Oral)   Ht 5\' 6"  (1.676 m)   Wt 166 lb 6.4 oz (75.5 kg)   BMI 26.86 kg/m  No LMP recorded. Patient has had a hysterectomy. Psych:  Alert and cooperative. Normal mood and affect. General:   Alert,  Well-developed, well-nourished, pleasant and cooperative in NAD Head:  Normocephalic and atraumatic. Eyes:  Sclera clear, no icterus.   Conjunctiva pink. Ears:  Normal auditory acuity. Nose:  No deformity, discharge, or lesions. Mouth:  No deformity  or lesions,oropharynx pink & moist. Neck:  Supple; no masses or thyromegaly. Abdomen:  Normal bowel sounds.  No bruits.  Soft, non-tender and non-distended without masses, hepatosplenomegaly or hernias noted.  No guarding or rebound tenderness.    Msk:  Symmetrical without gross deformities. Good, equal movement & strength bilaterally. Pulses:  Normal pulses noted. Extremities:  No clubbing or edema.  No cyanosis. Neurologic:  Alert and oriented x3;  grossly normal neurologically. Skin:  Intact without significant lesions or rashes. No jaundice. Lymph Nodes:  No significant cervical adenopathy. Psych:  Alert and cooperative. Normal mood and affect.   Labs: CBC    Component Value Date/Time   WBC 8.3 11/07/2018 1041   WBC 7.0 09/04/2017 0816   RBC 4.21 11/07/2018 1041   RBC 3.91 09/04/2017 0816   HGB 14.4 11/07/2018 1041   HCT 40.6 11/07/2018 1041   PLT 254 11/07/2018 1041   MCV 96 11/07/2018 1041   MCH 34.2 (H) 11/07/2018 1041   MCH 33.5 (H) 09/04/2017 0816   MCHC 35.5 11/07/2018 1041   MCHC 34.7 09/04/2017 0816   RDW 11.6 (L) 11/07/2018 1041   LYMPHSABS 2.1 11/07/2018 1041   MONOABS 621 05/01/2016 0859   EOSABS 0.1 11/07/2018 1041   BASOSABS 0.0 11/07/2018 1041   CMP     Component Value Date/Time   NA 138 11/07/2018 1041   K 4.5 11/07/2018 1041   CL 97 11/07/2018 1041   CO2 24 11/07/2018 1041   GLUCOSE 105 (H) 11/07/2018 1041   GLUCOSE 95 09/04/2017 0816   BUN 27 11/07/2018 1041   CREATININE 0.98 11/07/2018 1041   CREATININE 0.86 09/04/2017 0816   CALCIUM 10.1 11/07/2018 1041   PROT 7.5 11/07/2018 1041   ALBUMIN 4.9 (H) 11/07/2018 1041   AST 24 11/07/2018 1041   ALT 29 11/07/2018 1041   ALKPHOS 68 11/07/2018 1041   BILITOT 0.3 11/07/2018 1041   GFRNONAA 61 11/07/2018 1041   GFRNONAA 71 09/04/2017 0816   GFRAA 70 11/07/2018 1041   GFRAA 83 09/04/2017 0816    Imaging Studies: No results found.  Assessment and Plan:   Monica Chapman is a 68 y.o. y/o  female has been referred for loss of appetite, lost weight and history of colon polyps  Due to unintentional weight loss over the last 6 months, EGD and colonoscopy recommended  Patient is also due for polyp surveillance colonoscopy  Patient does report recent family stressors, but does not think that these are affecting her weight loss or appetite loss as she states those family stressors have resolved themselves  I have discussed alternative options, risks & benefits,  which include, but are not limited to, bleeding, infection, perforation,respiratory complication & drug reaction.  The patient agrees with this plan & written consent will  be obtained.      Dr Monica Chapman  Speech recognition software was used to dictate the above note.

## 2019-03-09 ENCOUNTER — Telehealth: Payer: Self-pay | Admitting: Gastroenterology

## 2019-03-09 ENCOUNTER — Other Ambulatory Visit
Admission: RE | Admit: 2019-03-09 | Discharge: 2019-03-09 | Disposition: A | Discharge status: Home / Self Care | Payer: Medicare Other | Source: Ambulatory Visit | Attending: Gastroenterology | Admitting: Gastroenterology

## 2019-03-09 ENCOUNTER — Other Ambulatory Visit: Payer: Self-pay

## 2019-03-09 DIAGNOSIS — Z1159 Encounter for screening for other viral diseases: Secondary | ICD-10-CM | POA: Diagnosis not present

## 2019-03-09 NOTE — Telephone Encounter (Signed)
Patient called & l/m on v/m stating she has a colonoscopy on Thursday 03-12-19 but has not heard anything from the hospital about a Covid test. Please call & advise.

## 2019-03-10 ENCOUNTER — Ambulatory Visit: Payer: Federal, State, Local not specified - PPO | Admitting: Gastroenterology

## 2019-03-10 LAB — NOVEL CORONAVIRUS, NAA (HOSP ORDER, SEND-OUT TO REF LAB; TAT 18-24 HRS): SARS-CoV-2, NAA: NOT DETECTED

## 2019-03-11 ENCOUNTER — Encounter: Payer: Self-pay | Admitting: *Deleted

## 2019-03-12 ENCOUNTER — Ambulatory Visit
Admission: RE | Admit: 2019-03-12 | Discharge: 2019-03-12 | Disposition: A | Discharge status: Home / Self Care | Payer: Medicare Other | Attending: Gastroenterology | Admitting: Gastroenterology

## 2019-03-12 ENCOUNTER — Ambulatory Visit: Payer: Medicare Other | Admitting: Certified Registered"

## 2019-03-12 ENCOUNTER — Other Ambulatory Visit: Payer: Self-pay

## 2019-03-12 ENCOUNTER — Encounter
Admission: RE | Disposition: A | Discharge status: Home / Self Care | Payer: Self-pay | Source: Home / Self Care | Attending: Gastroenterology

## 2019-03-12 ENCOUNTER — Encounter: Payer: Self-pay | Admitting: *Deleted

## 2019-03-12 DIAGNOSIS — K635 Polyp of colon: Secondary | ICD-10-CM | POA: Diagnosis not present

## 2019-03-12 DIAGNOSIS — K317 Polyp of stomach and duodenum: Secondary | ICD-10-CM | POA: Insufficient documentation

## 2019-03-12 DIAGNOSIS — Z860100 Personal history of colon polyps, unspecified: Secondary | ICD-10-CM

## 2019-03-12 DIAGNOSIS — R110 Nausea: Secondary | ICD-10-CM | POA: Diagnosis not present

## 2019-03-12 DIAGNOSIS — Z09 Encounter for follow-up examination after completed treatment for conditions other than malignant neoplasm: Secondary | ICD-10-CM | POA: Diagnosis not present

## 2019-03-12 DIAGNOSIS — Z6825 Body mass index (BMI) 25.0-25.9, adult: Secondary | ICD-10-CM | POA: Diagnosis not present

## 2019-03-12 DIAGNOSIS — K5730 Diverticulosis of large intestine without perforation or abscess without bleeding: Secondary | ICD-10-CM | POA: Diagnosis not present

## 2019-03-12 DIAGNOSIS — D124 Benign neoplasm of descending colon: Secondary | ICD-10-CM | POA: Diagnosis not present

## 2019-03-12 DIAGNOSIS — R6881 Early satiety: Secondary | ICD-10-CM | POA: Diagnosis not present

## 2019-03-12 DIAGNOSIS — D122 Benign neoplasm of ascending colon: Secondary | ICD-10-CM

## 2019-03-12 DIAGNOSIS — F17210 Nicotine dependence, cigarettes, uncomplicated: Secondary | ICD-10-CM | POA: Diagnosis not present

## 2019-03-12 DIAGNOSIS — I10 Essential (primary) hypertension: Secondary | ICD-10-CM | POA: Insufficient documentation

## 2019-03-12 DIAGNOSIS — Z79899 Other long term (current) drug therapy: Secondary | ICD-10-CM | POA: Diagnosis not present

## 2019-03-12 DIAGNOSIS — K228 Other specified diseases of esophagus: Secondary | ICD-10-CM

## 2019-03-12 DIAGNOSIS — R634 Abnormal weight loss: Secondary | ICD-10-CM | POA: Insufficient documentation

## 2019-03-12 DIAGNOSIS — K922 Gastrointestinal hemorrhage, unspecified: Secondary | ICD-10-CM | POA: Diagnosis not present

## 2019-03-12 DIAGNOSIS — D125 Benign neoplasm of sigmoid colon: Secondary | ICD-10-CM | POA: Diagnosis not present

## 2019-03-12 DIAGNOSIS — D131 Benign neoplasm of stomach: Secondary | ICD-10-CM | POA: Diagnosis not present

## 2019-03-12 DIAGNOSIS — Z7983 Long term (current) use of bisphosphonates: Secondary | ICD-10-CM | POA: Insufficient documentation

## 2019-03-12 DIAGNOSIS — K2270 Barrett's esophagus without dysplasia: Secondary | ICD-10-CM | POA: Diagnosis not present

## 2019-03-12 DIAGNOSIS — K2289 Other specified disease of esophagus: Secondary | ICD-10-CM

## 2019-03-12 HISTORY — PX: COLONOSCOPY WITH PROPOFOL: SHX5780

## 2019-03-12 HISTORY — PX: ESOPHAGOGASTRODUODENOSCOPY (EGD) WITH PROPOFOL: SHX5813

## 2019-03-12 SURGERY — COLONOSCOPY WITH PROPOFOL
Anesthesia: General

## 2019-03-12 MED ORDER — SODIUM CHLORIDE 0.9 % IV SOLN
INTRAVENOUS | Status: DC
  Administered 2019-03-12 (×2): via INTRAVENOUS

## 2019-03-12 MED ORDER — PROPOFOL 10 MG/ML IV BOLUS
INTRAVENOUS | Status: DC | PRN
  Administered 2019-03-12: 50 mg via INTRAVENOUS
  Administered 2019-03-12 (×5): 20 mg via INTRAVENOUS

## 2019-03-12 MED ORDER — PROPOFOL 500 MG/50ML IV EMUL
INTRAVENOUS | Status: DC | PRN
  Administered 2019-03-12: 110 ug/kg/min via INTRAVENOUS

## 2019-03-12 MED ORDER — GLYCOPYRROLATE 0.2 MG/ML IJ SOLN
INTRAMUSCULAR | Status: DC | PRN
  Administered 2019-03-12: 0.2 mg via INTRAVENOUS

## 2019-03-12 MED ORDER — LIDOCAINE HCL (CARDIAC) PF 100 MG/5ML IV SOSY
PREFILLED_SYRINGE | INTRAVENOUS | Status: DC | PRN
  Administered 2019-03-12: 100 mg via INTRATRACHEAL

## 2019-03-12 NOTE — Anesthesia Preprocedure Evaluation (Signed)
Anesthesia Evaluation  Patient identified by MRN, date of birth, ID band Patient awake    Reviewed: Allergy & Precautions, H&P , NPO status , Patient's Chart, lab work & pertinent test results, reviewed documented beta blocker date and time   History of Anesthesia Complications Negative for: history of anesthetic complications  Airway Mallampati: I  TM Distance: >3 FB Neck ROM: full    Dental  (+) Dental Advidsory Given, Caps, Teeth Intact   Pulmonary neg shortness of breath, neg COPD, neg recent URI, Current Smoker,           Cardiovascular Exercise Tolerance: Good hypertension, (-) angina(-) Past MI (-) dysrhythmias (-) Valvular Problems/Murmurs     Neuro/Psych negative neurological ROS  negative psych ROS   GI/Hepatic negative GI ROS, Neg liver ROS,   Endo/Other  negative endocrine ROS  Renal/GU negative Renal ROS  negative genitourinary   Musculoskeletal   Abdominal   Peds  Hematology negative hematology ROS (+)   Anesthesia Other Findings Past Medical History: No date: Arthritis No date: Hypertension   Reproductive/Obstetrics negative OB ROS                             Anesthesia Physical Anesthesia Plan  ASA: II  Anesthesia Plan: General   Post-op Pain Management:    Induction: Intravenous  PONV Risk Score and Plan: 2 and Propofol infusion and TIVA  Airway Management Planned: Natural Airway and Nasal Cannula  Additional Equipment:   Intra-op Plan:   Post-operative Plan:   Informed Consent: I have reviewed the patients History and Physical, chart, labs and discussed the procedure including the risks, benefits and alternatives for the proposed anesthesia with the patient or authorized representative who has indicated his/her understanding and acceptance.     Dental Advisory Given  Plan Discussed with: Anesthesiologist, CRNA and Surgeon  Anesthesia Plan Comments:          Anesthesia Quick Evaluation

## 2019-03-12 NOTE — Op Note (Signed)
[facility] Patient Name: Monica Chapman Procedure Date: 03/12/2019 12:56 PM MRN: [id number] Account #: [id number] Date of Birth: [birth year] Admit Type: Outpatient Age: 68 Room: [facility] ENDO ROOM 3 Gender: Female Note Status: Finalized Procedure:            Upper GI endoscopy Indications:          Early satiety, Nausea, Weight loss Providers:            Malee Grays B. Bonna Gains MD, MD Medicines:            Monitored Anesthesia Care Complications:        No immediate complications. Procedure:            Pre-Anesthesia Assessment:                       - Prior to the procedure, a History and Physical was                        performed, and patient medications, allergies and                        sensitivities were reviewed. The patient's tolerance of                        previous anesthesia was reviewed.                       - The risks and benefits of the procedure and the                        sedation options and risks were discussed with the                        patient. All questions were answered and informed                        consent was obtained.                       - Patient identification and proposed procedure were                        verified prior to the procedure by the physician, the                        nurse, the anesthesiologist, the anesthetist and the                        technician. The procedure was verified in the procedure                        room.                       - ASA Grade Assessment: II - A patient with mild                        systemic disease.                       After obtaining informed consent, the endoscope was  passed under direct vision. Throughout the procedure,                        the patient's blood pressure, pulse, and oxygen                        saturations were monitored continuously. The Endoscope                        was introduced through the  mouth, and advanced to the                        second part of duodenum. The upper GI endoscopy was                        accomplished with ease. The patient tolerated the                        procedure well. Findings:      Islands of salmon-colored mucosa were present. No other visible       abnormalities were present. The maximum longitudinal extent of these       esophageal mucosal changes was 1 cm in length. Mucosa was biopsied with       a cold forceps for histology in a targeted manner and in 4 quadrants.       One specimen bottle was sent to pathology. Only island was present.      The exam of the esophagus was otherwise normal.      The entire examined stomach was normal. Biopsies were obtained in the       gastric body, at the incisura and in the gastric antrum with cold       forceps for histology. Biopsies were taken with a cold forceps for       Helicobacter pylori testing.      Multiple 3 to 5 mm sessile polyps with no bleeding and no stigmata of       recent bleeding were found in the gastric body. Biopsies were taken with       a cold forceps for histology.      The duodenal bulb, second portion of the duodenum and examined duodenum       were normal. Impression:           - Salmon-colored mucosa suspicious for short-segment                        Barrett's esophagus. Biopsied.                       - Normal stomach. Biopsied.                       - Multiple gastric polyps. Biopsied.                       - Normal duodenal bulb, second portion of the duodenum                        and examined duodenum.                       - Biopsies were obtained in the gastric body, at the  incisura and in the gastric antrum. Recommendation:       - Await pathology results.                       - Discharge patient to home (with escort).                       - Advance diet as tolerated.                       - Continue present medications.                        - Patient has a contact number available for                        emergencies. The signs and symptoms of potential                        delayed complications were discussed with the patient.                        Return to normal activities tomorrow. Written discharge                        instructions were provided to the patient.                       - Discharge patient to home (with escort).                       - The findings and recommendations were discussed with                        the patient.                       - The findings and recommendations were discussed with                        the patient's family. Procedure Code(s):    --- Professional ---                       [phone], Esophagogastroduodenoscopy, flexible, transoral;                        with biopsy, single or multiple Diagnosis Code(s):    --- Professional ---                       K22.8, Other specified diseases of esophagus                       K31.7, Polyp of stomach and duodenum                       R68.81, Early satiety                       R11.0, Nausea                       R63.4, Abnormal weight loss CPT copyright 2019 American [employer] Association. All rights reserved. The  codes documented in this report are preliminary and upon coder review may  be revised to meet current compliance requirements.  [name], MD Margretta Sidle B. Bonna Gains MD, MD 03/12/2019 1:26:51 PM This report has been signed electronically. Number of Addenda: 0 Note Initiated On: 03/12/2019 12:56 PM Estimated Blood Loss: Estimated blood loss: none.      [facility]

## 2019-03-12 NOTE — Op Note (Signed)
[facility] Gastroenterology Patient Name: Monica Chapman Procedure Date: 03/12/2019 12:56 PM MRN: [id number] Account #: [id number] Date of Birth: 02/26/1951 Admit Type: Outpatient Age: 68 Room: [facility] ENDO ROOM 3 Gender: Female Note Status: Finalized Procedure:            Colonoscopy Indications:          High risk colon cancer surveillance: Personal history                        of colonic polyps Providers:            Megen Madewell B. Bonna Gains MD, MD Medicines:            Monitored Anesthesia Care Complications:        No immediate complications. Procedure:            Pre-Anesthesia Assessment:                       - ASA Grade Assessment: II - A patient with mild                        systemic disease.                       - Prior to the procedure, a History and Physical was                        performed, and patient medications, allergies and                        sensitivities were reviewed. The patient's tolerance of                        previous anesthesia was reviewed.                       - The risks and benefits of the procedure and the                        sedation options and risks were discussed with the                        patient. All questions were answered and informed                        consent was obtained.                       - Patient identification and proposed procedure were                        verified prior to the procedure by the physician, the                        nurse, the anesthesiologist, the anesthetist and the                        technician. The procedure was verified in the procedure                        room.  After obtaining informed consent, the colonoscope was                        passed under direct vision. Throughout the procedure,                        the patient's blood pressure, pulse, and oxygen                        saturations were monitored continuously. The                         Colonoscope was introduced through the anus and                        advanced to the the cecum, identified by appendiceal                        orifice and ileocecal valve. The colonoscopy was                        performed with ease. The patient tolerated the                        procedure well. The quality of the bowel preparation                        was good. Findings:      The perianal and digital rectal examinations were normal.      A 6 mm polyp was found in the ascending colon. The polyp was sessile.       Polypectomy was attempted, initially using a cold snare. Polyp resection       was incomplete with this device. This intervention then required a       different device and polypectomy technique. The polyp was removed with a       cold biopsy forceps. Resection and retrieval were complete.      Five sessile polyps were found in the descending colon and ascending       colon. The polyps were 4 to 6 mm in size. These polyps were removed with       a cold snare. Resection and retrieval were complete.      A 5 mm polyp was found in the sigmoid colon. The polyp was sessile. The       polyp was removed with a cold snare. Resection and retrieval were       complete.      A 5 mm polyp was found in the sigmoid colon. The polyp was sessile.       Biopsies were taken with a cold forceps for histology. This is likely an       inflammatory polyp, which does not require complete removal.      A patchy area of mildly erythematous mucosa was found in the sigmoid       colon. Biopsies were taken with a cold forceps for histology.      Multiple diverticula were found in the sigmoid colon.      The exam was otherwise without abnormality.      The rectum, sigmoid colon, descending colon, transverse colon, ascending       colon and cecum appeared normal.  The retroflexed view of the distal rectum and anal verge was normal and       showed no anal or rectal  abnormalities. Impression:           - One 6 mm polyp in the ascending colon, removed with a                        cold biopsy forceps. Resected and retrieved.                       - Five 4 to 6 mm polyps in the descending colon and in                        the ascending colon, removed with a cold snare.                        Resected and retrieved.                       - One 5 mm polyp in the sigmoid colon, removed with a                        cold snare. Resected and retrieved.                       - One 5 mm polyp in the sigmoid colon. Biopsied.                       - Erythematous mucosa in the sigmoid colon. Biopsied.                       - Diverticulosis in the sigmoid colon.                       - The examination was otherwise normal.                       - The rectum, sigmoid colon, descending colon,                        transverse colon, ascending colon and cecum are normal.                       - The distal rectum and anal verge are normal on                        retroflexion view. Recommendation:       - Discharge patient to home (with escort).                       - Advance diet as tolerated.                       - Continue present medications.                       - Await pathology results.                       - Repeat colonoscopy in 3 years.                       -  The findings and recommendations were discussed with                        the patient.                       - The findings and recommendations were discussed with                        the patient's family.                       - Return to primary care physician as previously                        scheduled.                       - High fiber diet. Procedure Code(s):    --- Professional ---                       [phone], Colonoscopy, flexible; with removal of tumor(s),                        polyp(s), or other lesion(s) by snare technique                       [id number], 62, Colonoscopy,  flexible; with biopsy, single                        or multiple Diagnosis Code(s):    --- Professional ---                       K63.5, Polyp of colon                       Z86.010, Personal history of colonic polyps                       K63.89, Other specified diseases of intestine                       K57.30, Diverticulosis of large intestine without                        perforation or abscess without bleeding CPT copyright 2019 American Medical Association. All rights reserved. The codes documented in this report are preliminary and upon coder review may  be revised to meet current compliance requirements.  [name], MD Margretta Sidle B. Bonna Gains MD, MD 03/12/2019 2:09:55 PM This report has been signed electronically. Number of Addenda: 0 Note Initiated On: 03/12/2019 12:56 PM Scope Withdrawal Time: 0 hours 26 minutes 4 seconds  Total Procedure Duration: 0 hours 32 minutes 6 seconds  Estimated Blood Loss: Estimated blood loss: none.      [facility]

## 2019-03-12 NOTE — Anesthesia Postprocedure Evaluation (Signed)
Anesthesia Post Note  Patient: HAJA CREGO  Procedure(s) Performed: COLONOSCOPY WITH PROPOFOL (N/A ) ESOPHAGOGASTRODUODENOSCOPY (EGD) WITH PROPOFOL (N/A )  Patient location during evaluation: Endoscopy Anesthesia Type: General Level of consciousness: awake and alert Pain management: pain level controlled Vital Signs Assessment: post-procedure vital signs reviewed and stable Respiratory status: spontaneous breathing, nonlabored ventilation, respiratory function stable and patient connected to nasal cannula oxygen Cardiovascular status: blood pressure returned to baseline and stable Postop Assessment: no apparent nausea or vomiting Anesthetic complications: no     Last Vitals:  Vitals:   03/12/19 1406 03/12/19 1426  BP: 119/60 (!) 153/67  Pulse:    Resp:    Temp: (!) 36.2 C   SpO2:      Last Pain:  Vitals:   03/12/19 1426  TempSrc:   PainSc: 0-No pain                 Martha Clan

## 2019-03-12 NOTE — Anesthesia Post-op Follow-up Note (Signed)
Anesthesia QCDR form completed.        

## 2019-03-12 NOTE — H&P (Signed)
[name], MD [address], [place], [place], [place], [address] [address], [place], [place], [place], [address] Phone: [phone]  Fax: [phone]  Primary Care Physician:  Olin Hauser, DO   Pre-Procedure History & Physical: HPI:  Monica Chapman is a 68 y.o. female is here for a colonoscopy and EGD.   Past Medical History:  Diagnosis Date  . Arthritis   . Hypertension     Past Surgical History:  Procedure Laterality Date  . ABDOMINAL HYSTERECTOMY    . FOOT SURGERY    . HAND SURGERY      Prior to Admission medications   Medication Sig Start Date End Date Taking? Authorizing Provider  alendronate (FOSAMAX) 70 MG tablet TAKE 1 TABLET(70 MG) BY MOUTH 1 TIME A WEEK WITH A FULL GLASS OF WATER AND ON AN EMPTY STOMACH 06/27/18  Yes Karamalegos, Devonne Doughty, DO  atorvastatin (LIPITOR) 10 MG tablet Take 1 tablet (10 mg total) by mouth daily. 11/10/18  Yes Karamalegos, Devonne Doughty, DO  calcium-vitamin D (OSCAL WITH D) 500-200 MG-UNIT per tablet Take 1 tablet by mouth.   Yes [provider]  Cetirizine HCl 10 MG CAPS Take 10 mg by mouth at bedtime.    Yes [provider]  famotidine (PEPCID) 20 MG tablet Take 1 tablet (20 mg total) by mouth daily. 03/03/19  Yes [name] B, MD  hydrochlorothiazide (HYDRODIURIL) 25 MG tablet TAKE 1 TABLET BY MOUTH DAILY 11/10/18  Yes Karamalegos, Devonne Doughty, DO  Melatonin 3 MG TABS Take by mouth as needed.   Yes [provider]  omeprazole (PRILOSEC) 20 MG capsule TAKE ONE CAPSULE BY MOUTH ONCE DAILY 02/20/18  Yes Karamalegos, Devonne Doughty, DO  escitalopram (LEXAPRO) 10 MG tablet TAKE 1 TABLET(10 MG) BY MOUTH DAILY 01/05/19   Parks Ranger, Devonne Doughty, DO  losartan (COZAAR) 100 MG tablet TAKE 1 TABLET BY MOUTH DAILY 01/05/19   Olin Hauser, DO    Allergies as of 03/04/2019 - Review Complete 03/03/2019  Allergen Reaction Noted  . Penicillins Itching 07/21/2014    Family History   Problem Relation Age of Onset  . Breast cancer Mother 1  . Breast cancer Maternal Grandmother     Social History   Socioeconomic History  . Marital status: Married    Spouse name: Not on file  . Number of children: Not on file  . Years of education: Not on file  . Highest education level: Not on file  Occupational History  . Not on file  Social Needs  . Financial resource strain: Not on file  . Food insecurity    Worry: Not on file    Inability: Not on file  . Transportation needs    Medical: Not on file    Non-medical: Not on file  Tobacco Use  . Smoking status: Current Every Day Smoker    Packs/day: 0.50    Types: Cigarettes    Last attempt to quit: 10/28/2015    Years since quitting: 3.3  . Smokeless tobacco: Never Used  Substance and Sexual Activity  . Alcohol use: Yes    Alcohol/week: 0.0 standard drinks    Comment: 3 glasses wine daily  . Drug use: Yes    Types: Marijuana  . Sexual activity: Not on file  Lifestyle  . Physical activity    Days per week: Not on file    Minutes per session: Not on file  . Stress: Not on file  Relationships  . Social connections  Talks on phone: Not on file    Gets together: Not on file    Attends religious service: Not on file    Active member of club or organization: Not on file    Attends meetings of clubs or organizations: Not on file    Relationship status: Not on file  . Intimate partner violence    Fear of current or ex partner: Not on file    Emotionally abused: Not on file    Physically abused: Not on file    Forced sexual activity: Not on file  Other Topics Concern  . Not on file  Social History Narrative  . Not on file    Review of Systems: See HPI, otherwise negative ROS  Physical Exam: BP 136/71   Pulse 85   Temp 99.1 F (37.3 C) (Oral)   Resp 18   Wt 72.1 kg   SpO2 100%   BMI 25.66 kg/m  General:   Alert,  pleasant and cooperative in NAD Head:  Normocephalic and atraumatic. Neck:  Supple;  no masses or thyromegaly. Lungs:  Clear throughout to auscultation, normal respiratory effort.    Heart:  +S1, +S2, Regular rate and rhythm, No edema. Abdomen:  Soft, nontender and nondistended. Normal bowel sounds, without guarding, and without rebound.   Neurologic:  Alert and  oriented x4;  grossly normal neurologically.  Impression/Plan: Monica Chapman is here for a colonoscopy and EGD for weight loss and polyp surveillance  Risks, benefits, limitations, and alternatives regarding the procedures have been reviewed with the patient.  Questions have been answered.  All parties agreeable.   Virgel Manifold, MD  03/12/2019, 1:03 PM

## 2019-03-12 NOTE — Transfer of Care (Signed)
Immediate Anesthesia Transfer of Care Note  Patient: Monica Chapman  Procedure(s) Performed: COLONOSCOPY WITH PROPOFOL (N/A ) ESOPHAGOGASTRODUODENOSCOPY (EGD) WITH PROPOFOL (N/A )  Patient Location: Endoscopy Unit  Anesthesia Type:General  Level of Consciousness: drowsy and patient cooperative  Airway & Oxygen Therapy: Patient Spontanous Breathing and Patient connected to face mask oxygen  Post-op Assessment: Report given to RN and Post -op Vital signs reviewed and stable  Post vital signs: Reviewed and stable  Last Vitals:  Vitals Value Taken Time  BP 119/60 03/12/19 1406  Temp    Pulse 78 03/12/19 1407  Resp 19 03/12/19 1407  SpO2 100 % 03/12/19 1407  Vitals shown include unvalidated device data.  Last Pain:  Vitals:   03/12/19 1406  TempSrc: (P) Tympanic  PainSc:          Complications: No apparent anesthesia complications

## 2019-03-13 ENCOUNTER — Encounter: Payer: Self-pay | Admitting: Gastroenterology

## 2019-03-16 LAB — SURGICAL PATHOLOGY

## 2019-03-16 NOTE — Telephone Encounter (Signed)
Pt had colonoscopy on 03/12/2019.

## 2019-03-17 ENCOUNTER — Ambulatory Visit
Admission: RE | Admit: 2019-03-17 | Discharge: 2019-03-17 | Disposition: A | Discharge status: Home / Self Care | Payer: Medicare Other | Source: Ambulatory Visit | Attending: Family Medicine | Admitting: Family Medicine

## 2019-03-17 ENCOUNTER — Telehealth: Payer: Self-pay | Admitting: Gastroenterology

## 2019-03-17 ENCOUNTER — Other Ambulatory Visit: Payer: Self-pay

## 2019-03-17 DIAGNOSIS — Z1231 Encounter for screening mammogram for malignant neoplasm of breast: Secondary | ICD-10-CM | POA: Diagnosis not present

## 2019-03-17 NOTE — Telephone Encounter (Signed)
Patient called & l/m on v/m she would like results from biopsy done last Thursday.

## 2019-03-18 ENCOUNTER — Other Ambulatory Visit: Payer: Self-pay | Admitting: Gastroenterology

## 2019-03-18 ENCOUNTER — Encounter: Payer: Self-pay | Admitting: Gastroenterology

## 2019-03-19 ENCOUNTER — Ambulatory Visit: Payer: Federal, State, Local not specified - PPO | Admitting: Gastroenterology

## 2019-03-20 ENCOUNTER — Ambulatory Visit: Payer: Medicare Other | Admitting: Family Medicine

## 2019-03-23 NOTE — Telephone Encounter (Signed)
Pt left vm to get her results please call pt

## 2019-03-23 NOTE — Telephone Encounter (Signed)
Patient came by the [facility] office this morning due to no contact back from a nurse. Monica Chapman is still having problem with bloating and burping and diarrhea. Please call her back.

## 2019-03-24 NOTE — Telephone Encounter (Signed)
When calling pt, I got business voice mail. No message left.

## 2019-03-24 NOTE — Telephone Encounter (Signed)
Tried to reach pt again and got same business message. No message left but letter mailed today.

## 2019-03-26 NOTE — Telephone Encounter (Signed)
Pt is calling back for nurse regarding her symptoms please call her at  cb [phone]

## 2019-03-26 NOTE — Telephone Encounter (Signed)
Spoke with pt and she is currently taking Omeprazole once daily, I instructed pt to increase to twice daily 30 minutes before meals per Dr. Bonna Gains , pt verbalized understanding and will call back in symptoms persist or worsen

## 2019-04-28 ENCOUNTER — Other Ambulatory Visit: Payer: Self-pay | Admitting: Gastroenterology

## 2019-04-28 ENCOUNTER — Ambulatory Visit: Payer: Federal, State, Local not specified - PPO | Admitting: Gastroenterology

## 2019-04-28 ENCOUNTER — Encounter: Payer: Self-pay | Admitting: Gastroenterology

## 2019-04-28 ENCOUNTER — Ambulatory Visit (INDEPENDENT_AMBULATORY_CARE_PROVIDER_SITE_OTHER): Payer: Medicare Other | Admitting: Gastroenterology

## 2019-04-28 ENCOUNTER — Other Ambulatory Visit: Payer: Self-pay

## 2019-04-28 VITALS — BP 98/56 | HR 78 | Temp 96.6°F | Ht 66.0 in | Wt 157.0 lb

## 2019-04-28 DIAGNOSIS — R6881 Early satiety: Secondary | ICD-10-CM

## 2019-04-28 DIAGNOSIS — K2270 Barrett's esophagus without dysplasia: Secondary | ICD-10-CM | POA: Diagnosis not present

## 2019-04-28 MED ORDER — OMEPRAZOLE 40 MG PO CPDR: 40.0000 mg | DELAYED_RELEASE_CAPSULE | Freq: Two times a day (BID) | ORAL | 0 refills | Status: DC

## 2019-04-28 NOTE — Patient Instructions (Signed)
Barrett's Esophagus ° °Barrett's esophagus occurs when the tissue that lines the esophagus changes or becomes damaged. The esophagus is the tube that carries food from the throat to the stomach. With Barrett's esophagus, the cells that line the esophagus are replaced by cells that are similar to the lining of the intestines (intestinal metaplasia). °Barrett's esophagus itself may not cause any symptoms. However, many people who have Barrett's esophagus also have gastroesophageal reflux disease (GERD), which may cause symptoms such as heartburn. Over time, a few people with this condition may develop cancer of the esophagus. Treatment may include medicines, procedures to destroy the abnormal cells, or surgery. °What are the causes? °The exact cause of this condition is not known. In some cases, the condition develops from damage to the lining of the esophagus caused by GERD. GERD occurs when stomach acids flow up from the stomach into the esophagus. Frequent symptoms of GERD may cause intestinal metaplasia or cause cell changes (dysplasia). °What increases the risk? °You are more likely to develop this condition if you: °· Have GERD. °· Are female. °· Are Caucasian. °· Are obese. °· Are older than 50. °· Have a hiatal hernia. This is a condition in which part of your stomach bulges into your chest. °· Smoke. °What are the signs or symptoms? °People with Barrett's esophagus often have no symptoms. However, many people with this condition also have GERD. Symptoms of GERD may include: °· Heartburn. °· Difficulty swallowing. °· Dry cough. °How is this diagnosed? °This condition may be diagnosed based on: °· Results of an upper gastrointestinal endoscopy. For this exam, a thin, flexible tube with a light and a camera on the end (endoscope) is passed down your esophagus. Your health care provider can view the inside of your esophagus during this procedure. °· Results of a biopsy. For this procedure, several tissue samples  are removed (biopsy) from your esophagus. They are then checked for intestinal metaplasia or dysplasia. °How is this treated? °Treatment for this condition may include: °· Medicines (proton pump inhibitors, or PPIs) to decrease or stop GERD. °· Periodic endoscopic exams to make sure that cancer is not developing. °· A procedure or surgery for dysplasia. This may include: °? Removal or destruction of abnormal cells. °? Removal of part of the esophagus (esophagectomy). °Follow these instructions at home: °Eating and drinking °· Eat more fruits and vegetables. °· Avoid fatty foods. °· Eat small, frequent meals instead of large meals. °· Avoid foods that cause heartburn. These foods include: °? Coffee and alcoholic drinks. °? Tomatoes and foods made with tomatoes. °? Greasy or spicy foods. °? Chocolate and peppermint. °· Do not drink alcohol. °General instructions °· Take over-the-counter and prescription medicines only as told by your health care provider. °· Do not use any products that contain nicotine or tobacco, such as cigarettes and e-cigarettes. If you need help quitting, ask your health care provider. °· If you are being treated for GERD, make sure you take medicines and follow all instructions as told by your health care provider. °· Keep all follow-up visits as told by your health care provider. This is important. °Contact a health care provider if: °· You have heartburn or GERD symptoms. °· You have difficulty swallowing. °Get help right away if: °· You have chest pain. °· You are unable to swallow. °· You vomit blood or material that looks like coffee grounds. °· Your stool (feces) is bright red or dark. °Summary °· Barrett's esophagus occurs when the tissue that lines   the esophagus changes or becomes damaged.  Barrett's esophagus may be diagnosed with an upper gastrointestinal endoscopy and a biopsy.  Treatment may include medicines, procedures to remove abnormal cells, or surgery.  Follow your  health care provider's instructions about what to eat and drink, what medicines to take, and when to call for help. This information is not intended to replace advice given to you by your health care provider. Make sure you discuss any questions you have with your health care provider. Document Released: 12/01/2003 Document Revised: 01/06/2018 Document Reviewed: 01/06/2018 Elsevier Patient Education  [place].

## 2019-04-28 NOTE — Progress Notes (Signed)
Monica Antigua, MD [address]  [place]  [place], [place] [address]  Main: [phone]  Fax: [phone]   Primary Care Physician: Olin Hauser, DO   Chief Complaint  Patient presents with  . Follow-up    WEIGHT LOSS, CLE, Nausea, diverticulosis    HPI: Monica Chapman is a 68 y.o. female here for follow-up of early satiety, and weight loss.  Patient underwent EGD and colonoscopy for her symptoms.  Patient continues to report early satiety.  When I asked her if she has any anxiety or depression or recent stressors, she stated "no more than usual".  Her CBC CMP has been otherwise reassuring with no anemia, normal liver enzymes.  No blood in stool, no altered bowel habits, no dysphagia.  Reports burping daily.  Takes omeprazole and this controls her heartburn well.  States omeprazole has made a difference in her symptoms and she is taking 20 mg twice a day.  EGD showed short segment Barrett's esophagus, gastric polyps, otherwise normal.  Colonoscopy with 8 subcentimeter polyps removed.  Sigmoid erythema noted and biopsied.  Diverticulosis noted.  Surgical Pathology  CASE: [id number]  PATIENT: Monica Chapman  Surgical Pathology Report      SPECIMEN SUBMITTED:  A. Stomach, r/o h pylori; cbx  B. Stomach polyp; cbx  C. GEJ, salmon colored mucosa; cbx  D. Colon polyp x5, ascending (2), descending (3); cold snare  E. Colon polyp x2, sigmoid; cold snare  F. Sigmoid colon erythema; cbx   CLINICAL HISTORY:  None provided   PRE-OPERATIVE DIAGNOSIS:  Weight loss R63.4; HX polyps Z86.010   POST-OPERATIVE DIAGNOSIS:  Gastric polyp, colon polyps      DIAGNOSIS:  A. STOMACH; COLD BIOPSY:  - OXYNTIC-TYPE MUCOSA WITHOUT PATHOLOGIC CHANGES.  - NEGATIVE FOR H. PYLORI, INTESTINAL METAPLASIA, DYSPLASIA, AND  MALIGNANCY.   B. STOMACH POLYP; COLD BIOPSY:  - FUNDIC GLAND POLYP.  - NEGATIVE FOR DYSPLASIA AND MALIGNANCY.   C. GASTROESOPHAGEAL  JUNCTION, SALMON-COLORED MUCOSA; COLD BIOPSY:  - SQUAMOCOLUMNAR MUCOSA WITH INTESTINAL METAPLASIA INVOLVING ONE OF  SEVERAL FRAGMENTS.  - NEGATIVE FOR DYSPLASIA AND MALIGNANCY.  - CORRELATION WITH THE ENDOSCOPIC APPEARANCE IS REQUIRED. THE  HISTOLOGIC FEATURES WOULD SUPPORT A DIAGNOSIS OF BARRETT'S ESOPHAGUS IN  THE CORRECT CLINICAL SETTING.   D. COLON POLYP X 5, ASCENDING (2) AND DESCENDING (3); COLD SNARE:  - TUBULAR ADENOMAS, 7 FRAGMENTS, SEE COMMENT.  - NEGATIVE FOR HIGH-GRADE DYSPLASIA AND MALIGNANCY.   E. COLON POLYP X 2, SIGMOID; COLD SNARE:  - HYPERPLASTIC POLYPS, 2 FRAGMENTS.  - NEGATIVE FOR DYSPLASIA AND MALIGNANCY.   F. SIGMOID COLON ERYTHEMA; COLD BIOPSY:  - COLONIC MUCOSA WITH INTACT CRYPT ARCHITECTURE.  - LYMPHOID AGGREGATE AND SUPERFICIAL HEMORRHAGE.  - NEGATIVE FOR COLITIS AND DYSPLASIA.   Current Outpatient Medications  Medication Sig Dispense Refill  . alendronate (FOSAMAX) 70 MG tablet TAKE 1 TABLET(70 MG) BY MOUTH 1 TIME A WEEK WITH A FULL GLASS OF WATER AND ON AN EMPTY STOMACH 12 tablet 3  . atorvastatin (LIPITOR) 10 MG tablet Take 1 tablet (10 mg total) by mouth daily. 90 tablet 1  . calcium-vitamin D (OSCAL WITH D) 500-200 MG-UNIT per tablet Take 1 tablet by mouth.    . Cetirizine HCl 10 MG CAPS Take 10 mg by mouth at bedtime.     Marland Kitchen escitalopram (LEXAPRO) 10 MG tablet TAKE 1 TABLET(10 MG) BY MOUTH DAILY 90 tablet 1  . hydrochlorothiazide (HYDRODIURIL) 25 MG tablet TAKE 1 TABLET BY MOUTH DAILY 90 tablet 1  .  losartan (COZAAR) 100 MG tablet TAKE 1 TABLET BY MOUTH DAILY 90 tablet 1  . Melatonin 3 MG TABS Take by mouth as needed.    Marland Kitchen omeprazole (PRILOSEC) 40 MG capsule Take 1 capsule (40 mg total) by mouth 2 (two) times daily. 60 capsule 0   No current facility-administered medications for this visit.     Allergies as of 04/28/2019 - Review Complete 04/28/2019  Allergen Reaction Noted  . Penicillins Itching 07/21/2014    ROS:  General: Negative for  anorexia, weight loss, fever, chills, fatigue, weakness. ENT: Negative for hoarseness, difficulty swallowing , nasal congestion. CV: Negative for chest pain, angina, palpitations, dyspnea on exertion, peripheral edema.  Respiratory: Negative for dyspnea at rest, dyspnea on exertion, cough, sputum, wheezing.  GI: See history of present illness. GU:  Negative for dysuria, hematuria, urinary incontinence, urinary frequency, nocturnal urination.  Endo: Negative for unusual weight change.    Physical Examination:   BP (!) 98/56   Pulse 78   Temp (!) 96.6 F (35.9 C) (Oral)   Ht 5\' 6"  (1.676 m)   Wt 157 lb (71.2 kg)   BMI 25.34 kg/m   General: Well-nourished, well-developed in no acute distress.  Eyes: No icterus. Conjunctivae pink. Mouth: Oropharyngeal mucosa moist and pink , no lesions erythema or exudate. Neck: Supple, Trachea midline Abdomen: Bowel sounds are normal, nontender, nondistended, no hepatosplenomegaly or masses, no abdominal bruits or hernia , no rebound or guarding.   Extremities: No lower extremity edema. No clubbing or deformities. Neuro: Alert and oriented x 3.  Grossly intact. Skin: Warm and dry, no jaundice.   Psych: Alert and cooperative, normal mood and affect.   Labs: CMP     Component Value Date/Time   NA 138 11/07/2018 1041   K 4.5 11/07/2018 1041   CL 97 11/07/2018 1041   CO2 24 11/07/2018 1041   GLUCOSE 105 (H) 11/07/2018 1041   GLUCOSE 95 09/04/2017 0816   BUN 27 11/07/2018 1041   CREATININE 0.98 11/07/2018 1041   CREATININE 0.86 09/04/2017 0816   CALCIUM 10.1 11/07/2018 1041   PROT 7.5 11/07/2018 1041   ALBUMIN 4.9 (H) 11/07/2018 1041   AST 24 11/07/2018 1041   ALT 29 11/07/2018 1041   ALKPHOS 68 11/07/2018 1041   BILITOT 0.3 11/07/2018 1041   GFRNONAA 61 11/07/2018 1041   GFRNONAA 71 09/04/2017 0816   GFRAA 70 11/07/2018 1041   GFRAA 83 09/04/2017 0816   Lab Results  Component Value Date   WBC 8.3 11/07/2018   HGB 14.4 11/07/2018    HCT 40.6 11/07/2018   MCV 96 11/07/2018   PLT 254 11/07/2018    Imaging Studies: No results found.  Assessment and Plan:   Monica Chapman is a 68 y.o. y/o female with early satiety and weight loss  Her EGD and colonoscopy have been unrevealing for causes of early satiety is no weight loss  Loss of appetite or early satiety is usually caused by alternative diagnoses, the primary care provider can work on.  This could include depression and anxiety.  Patient does admit that she may have these issues but she puts them on the back burner.  Follow-up with PCP in this regard.  She did inquire if a CT scan would be needed.  I discussed with her that a CT abdomen pelvis will likely be low yield given her negative work-up so far and reassuring labs, but if she would like this done, we can consider doing it.  She states  she will hold off at this time.  I have asked her to try to work on eating better as well.  Since she has noted a improvement on omeprazole, she is interested in increasing the dose as she still has frequent burping to see if it helps her symptoms.  Therefore, we will try a short course of omeprazole 40 mg twice daily for 30 days.  I have asked her to let us know after 30 days how she is doing and we can change her dosage at that point.  After 30 days we would likely decrease it back to once daily 20 mg only due to her Barrett's esophagus.  Patient educated extensively on acid reflux lifestyle modification, including buying a bed wedge, not eating 3 hrs before bedtime, diet modifications, and handout given for the same.   (Risks of PPI use were discussed with patient including bone loss, C. Diff diarrhea, pneumonia, infections, CKD, electrolyte abnormalities.  If clinically possible based on symptoms, goal would be to maintain patient on the lowest dose possible, or discontinue the medication with institution of acid reflux lifestyle modifications over time. Pt. Verbalizes  understanding and chooses to continue the medication.)  Repeat EGD in 3 years from June 2020  Repeat colonoscopy in 3 years from June 2020  Dr Monica Chapman

## 2019-05-19 ENCOUNTER — Other Ambulatory Visit: Payer: Self-pay

## 2019-05-19 MED ORDER — OMEPRAZOLE 20 MG PO CPDR: 20.0000 mg | DELAYED_RELEASE_CAPSULE | Freq: Every day | ORAL | 1 refills | Status: DC

## 2019-05-19 NOTE — Telephone Encounter (Signed)
I called pt to inquire how the omeprazole was working for her and she stated she is doing fine with it. She is currently taking omeprazole 40mg  2xd, x30 days. Per Dr. Michele Mcalpine notes, pt may decrease to omeprazole to 20mg  daily, if appropriate for Barrett's Esophagus. Pt states she is okay with this. Dr. Bonna Gains to advise.

## 2019-07-07 ENCOUNTER — Ambulatory Visit (INDEPENDENT_AMBULATORY_CARE_PROVIDER_SITE_OTHER): Payer: Medicare Other

## 2019-07-07 ENCOUNTER — Other Ambulatory Visit: Payer: Self-pay

## 2019-07-07 DIAGNOSIS — Z23 Encounter for immunization: Secondary | ICD-10-CM

## 2019-08-10 ENCOUNTER — Other Ambulatory Visit: Payer: Self-pay | Admitting: Family Medicine

## 2019-08-10 DIAGNOSIS — I10 Essential (primary) hypertension: Secondary | ICD-10-CM

## 2019-08-11 ENCOUNTER — Other Ambulatory Visit: Payer: Self-pay

## 2019-08-11 ENCOUNTER — Ambulatory Visit (INDEPENDENT_AMBULATORY_CARE_PROVIDER_SITE_OTHER): Payer: Medicare Other

## 2019-08-11 DIAGNOSIS — Z23 Encounter for immunization: Secondary | ICD-10-CM | POA: Diagnosis not present

## 2019-08-11 DIAGNOSIS — Z0000 Encounter for general adult medical examination without abnormal findings: Secondary | ICD-10-CM

## 2019-08-11 NOTE — Patient Instructions (Signed)
Monica Chapman , Thank you for taking time to come for your Medicare Wellness Visit. I appreciate your ongoing commitment to your health goals. Please review the following plan we discussed and let me know if I can assist you in the future.   Screening recommendations/referrals: Colonoscopy: completed 03/12/2019 Mammogram: completed 03/17/2019 Bone Density: completed 10/27/2014 Recommended yearly ophthalmology/optometry visit for glaucoma screening and checkup Recommended yearly dental visit for hygiene and checkup  Vaccinations: Influenza vaccine: up to date Pneumococcal vaccine: prevnar 13 done today, booster due in one year  Tdap vaccine: up to date Shingles vaccine: shingrix eligible     Advanced directives: Please bring a copy of your health care power of attorney and living will to the office at your convenience.  Conditions/risks identified: discussed lung cancer screening, declined   Next appointment: Follow up in one year for your annual wellness visit    Preventive Care 65 Years and Older, Female Preventive care refers to lifestyle choices and visits with your health care provider that can promote health and wellness. What does preventive care include?  A yearly physical exam. This is also called an annual well check.  Dental exams once or twice a year.  Routine eye exams. Ask your health care provider how often you should have your eyes checked.  Personal lifestyle choices, including:  Daily care of your teeth and gums.  Regular physical activity.  Eating a healthy diet.  Avoiding tobacco and drug use.  Limiting alcohol use.  Practicing safe sex.  Taking low-dose aspirin every day.  Taking vitamin and mineral supplements as recommended by your health care provider. What happens during an annual well check? The services and screenings done by your health care provider during your annual well check will depend on your age, overall health, lifestyle risk  factors, and family history of disease. Counseling  Your health care provider may ask you questions about your:  Alcohol use.  Tobacco use.  Drug use.  Emotional well-being.  Home and relationship well-being.  Sexual activity.  Eating habits.  History of falls.  Memory and ability to understand (cognition).  Work and work Statistician.  Reproductive health. Screening  You may have the following tests or measurements:  Height, weight, and BMI.  Blood pressure.  Lipid and cholesterol levels. These may be checked every 5 years, or more frequently if you are over 5 years old.  Skin check.  Lung cancer screening. You may have this screening every year starting at age 76 if you have a 30-pack-year history of smoking and currently smoke or have quit within the past 15 years.  Fecal occult blood test (FOBT) of the stool. You may have this test every year starting at age 78.  Flexible sigmoidoscopy or colonoscopy. You may have a sigmoidoscopy every 5 years or a colonoscopy every 10 years starting at age 49.  Hepatitis C blood test.  Hepatitis B blood test.  Sexually transmitted disease (STD) testing.  Diabetes screening. This is done by checking your blood sugar (glucose) after you have not eaten for a while (fasting). You may have this done every 1-3 years.  Bone density scan. This is done to screen for osteoporosis. You may have this done starting at age 12.  Mammogram. This may be done every 1-2 years. Talk to your health care provider about how often you should have regular mammograms. Talk with your health care provider about your test results, treatment options, and if necessary, the need for more tests. Vaccines  Your health care provider may recommend certain vaccines, such as:  Influenza vaccine. This is recommended every year.  Tetanus, diphtheria, and acellular pertussis (Tdap, Td) vaccine. You may need a Td booster every 10 years.  Zoster vaccine. You  may need this after age 26.  Pneumococcal 13-valent conjugate (PCV13) vaccine. One dose is recommended after age 24.  Pneumococcal polysaccharide (PPSV23) vaccine. One dose is recommended after age 3. Talk to your health care provider about which screenings and vaccines you need and how often you need them. This information is not intended to replace advice given to you by your health care provider. Make sure you discuss any questions you have with your health care provider. Document Released: 10/07/2015 Document Revised: 05/30/2016 Document Reviewed: 07/12/2015 Elsevier Interactive Patient Education  2017 [place] Prevention in the Home Falls can cause injuries. They can happen to people of all ages. There are many things you can do to make your home safe and to help prevent falls. What can I do on the outside of my home?  Regularly fix the edges of walkways and driveways and fix any cracks.  Remove anything that might make you trip as you walk through a door, such as a raised step or threshold.  Trim any bushes or trees on the path to your home.  Use bright outdoor lighting.  Clear any walking paths of anything that might make someone trip, such as rocks or tools.  Regularly check to see if handrails are loose or broken. Make sure that both sides of any steps have handrails.  Any raised decks and porches should have guardrails on the edges.  Have any leaves, snow, or ice cleared regularly.  Use sand or salt on walking paths during winter.  Clean up any spills in your garage right away. This includes oil or grease spills. What can I do in the bathroom?  Use night lights.  Install grab bars by the toilet and in the tub and shower. Do not use towel bars as grab bars.  Use non-skid mats or decals in the tub or shower.  If you need to sit down in the shower, use a plastic, non-slip stool.  Keep the floor dry. Clean up any water that spills on the floor as soon as  it happens.  Remove soap buildup in the tub or shower regularly.  Attach bath mats securely with double-sided non-slip rug tape.  Do not have throw rugs and other things on the floor that can make you trip. What can I do in the bedroom?  Use night lights.  Make sure that you have a light by your bed that is easy to reach.  Do not use any sheets or blankets that are too big for your bed. They should not hang down onto the floor.  Have a firm chair that has side arms. You can use this for support while you get dressed.  Do not have throw rugs and other things on the floor that can make you trip. What can I do in the kitchen?  Clean up any spills right away.  Avoid walking on wet floors.  Keep items that you use a lot in easy-to-reach places.  If you need to reach something above you, use a strong step stool that has a grab bar.  Keep electrical cords out of the way.  Do not use floor polish or wax that makes floors slippery. If you must use wax, use non-skid floor wax.  Do  not have throw rugs and other things on the floor that can make you trip. What can I do with my stairs?  Do not leave any items on the stairs.  Make sure that there are handrails on both sides of the stairs and use them. Fix handrails that are broken or loose. Make sure that handrails are as long as the stairways.  Check any carpeting to make sure that it is firmly attached to the stairs. Fix any carpet that is loose or worn.  Avoid having throw rugs at the top or bottom of the stairs. If you do have throw rugs, attach them to the floor with carpet tape.  Make sure that you have a light switch at the top of the stairs and the bottom of the stairs. If you do not have them, ask someone to add them for you. What else can I do to help prevent falls?  Wear shoes that:  Do not have high heels.  Have rubber bottoms.  Are comfortable and fit you well.  Are closed at the toe. Do not wear sandals.  If  you use a stepladder:  Make sure that it is fully opened. Do not climb a closed stepladder.  Make sure that both sides of the stepladder are locked into place.  Ask someone to hold it for you, if possible.  Clearly mark and make sure that you can see:  Any grab bars or handrails.  First and last steps.  Where the edge of each step is.  Use tools that help you move around (mobility aids) if they are needed. These include:  Canes.  Walkers.  Scooters.  Crutches.  Turn on the lights when you go into a dark area. Replace any light bulbs as soon as they burn out.  Set up your furniture so you have a clear path. Avoid moving your furniture around.  If any of your floors are uneven, fix them.  If there are any pets around you, be aware of where they are.  Review your medicines with your doctor. Some medicines can make you feel dizzy. This can increase your chance of falling. Ask your doctor what other things that you can do to help prevent falls. This information is not intended to replace advice given to you by your health care provider. Make sure you discuss any questions you have with your health care provider. Document Released: 07/07/2009 Document Revised: 02/16/2016 Document Reviewed: 10/15/2014 Elsevier Interactive Patient Education  2017 [employer].

## 2019-08-11 NOTE — Progress Notes (Signed)
Subjective:   Monica Chapman is a 68 y.o. female who presents for an Initial Medicare Annual Wellness Visit.  Review of Systems      Cardiac Risk Factors include: advanced age (>56men, >83 women);dyslipidemia;hypertension;smoking/ tobacco exposure     Objective:    Today's Vitals   08/11/19 1135  BP: (!) 111/51  Pulse: 66  Resp: 16  Temp: 98.8 F (37.1 C)  TempSrc: Oral  Weight: 149 lb 9.6 oz (67.9 kg)  Height: 5\' 3"  (1.6 m)   Body mass index is 26.5 kg/m.  Advanced Directives 08/11/2019 03/12/2019  Does Patient Have a Medical Advance Directive? Yes No  Type of Advance Directive Living will;Healthcare Power of Attorney -  [place] in Chart? No - copy requested -  Would patient like information on creating a medical advance directive? - No - Patient declined    Current Medications (verified) Outpatient Encounter Medications as of 08/11/2019  Medication Sig  . alendronate (FOSAMAX) 70 MG tablet TAKE 1 TABLET(70 MG) BY MOUTH 1 TIME A WEEK WITH A FULL GLASS OF WATER AND ON AN EMPTY STOMACH  . atorvastatin (LIPITOR) 10 MG tablet Take 1 tablet (10 mg total) by mouth daily.  . calcium-vitamin D (OSCAL WITH D) 500-200 MG-UNIT per tablet Take 1 tablet by mouth.  . Cetirizine HCl 10 MG CAPS Take 10 mg by mouth at bedtime.   . hydrochlorothiazide (HYDRODIURIL) 25 MG tablet TAKE 1 TABLET BY MOUTH DAILY  . losartan (COZAAR) 100 MG tablet TAKE 1 TABLET BY MOUTH DAILY  . Melatonin 3 MG TABS Take by mouth as needed.  Marland Kitchen omeprazole (PRILOSEC) 20 MG capsule Take 1 capsule (20 mg total) by mouth daily.  . [DISCONTINUED] escitalopram (LEXAPRO) 10 MG tablet TAKE 1 TABLET(10 MG) BY MOUTH DAILY (Patient not taking: Reported on 08/11/2019)  . [DISCONTINUED] omeprazole (PRILOSEC) 40 MG capsule Take 1 capsule (40 mg total) by mouth 2 (two) times daily.   No facility-administered encounter medications on file as of 08/11/2019.     Allergies (verified)  Penicillins   History: Past Medical History:  Diagnosis Date  . Arthritis   . Barrett esophagus   . Hypertension    Past Surgical History:  Procedure Laterality Date  . ABDOMINAL HYSTERECTOMY    . COLONOSCOPY WITH PROPOFOL N/A 03/12/2019   Procedure: COLONOSCOPY WITH PROPOFOL;  Surgeon: Virgel Manifold, MD;  Location: [facility] ENDOSCOPY;  Service: Endoscopy;  Laterality: N/A;  . ESOPHAGOGASTRODUODENOSCOPY (EGD) WITH PROPOFOL N/A 03/12/2019   Procedure: ESOPHAGOGASTRODUODENOSCOPY (EGD) WITH PROPOFOL;  Surgeon: Virgel Manifold, MD;  Location: [facility] ENDOSCOPY;  Service: Endoscopy;  Laterality: N/A;  . FOOT SURGERY    . HAND SURGERY     Family History  Problem Relation Age of Onset  . Breast cancer Mother 31  . Breast cancer Maternal Grandmother    Social History   Socioeconomic History  . Marital status: Married    Spouse name: Not on file  . Number of children: Not on file  . Years of education: Not on file  . Highest education level: High school graduate  Occupational History  . Not on file  Social Needs  . Financial resource strain: Not hard at all  . Food insecurity    Worry: Never true    Inability: Never true  . Transportation needs    Medical: No    Non-medical: No  Tobacco Use  . Smoking status: Former Smoker    Packs/day: 0.75    Types:  Cigarettes  . Smokeless tobacco: Never Used  . Tobacco comment: quit in 2017, restarted.   Substance and Sexual Activity  . Alcohol use: Yes    Alcohol/week: 21.0 standard drinks    Types: 21 Glasses of wine per week    Comment: 3 glasses wine daily  . Drug use: Yes    Types: Marijuana    Comment: occasional   . Sexual activity: Not on file  Lifestyle  . Physical activity    Days per week: 0 days    Minutes per session: 0 min  . Stress: Not at all  Relationships  . Social connections    Talks on phone: More than three times a week    Gets together: Once a week    Attends religious service: Never    Active  member of club or organization: No    Attends meetings of clubs or organizations: Never    Relationship status: Married  Other Topics Concern  . Not on file  Social History Narrative  . Not on file    Tobacco Counseling Counseling given: Not Answered Comment: quit in 2017, restarted.    Clinical Intake:  Pre-visit preparation completed: Yes  Pain : No/denies pain     Nutritional Status: BMI 25 -29 Overweight Nutritional Risks: None Diabetes: No  How often do you need to have someone help you when you read instructions, pamphlets, or other written materials from your doctor or pharmacy?: 1 - Never  Interpreter Needed?: No  Information entered by :: Tiffany HIll,LPN   Activities of Daily Living In your present state of health, do you have any difficulty performing the following activities: 08/11/2019  Hearing? N  Comment no hearing aids  Vision? N  Comment eyeglasses,  eye center  Difficulty concentrating or making decisions? N  Walking or climbing stairs? N  Dressing or bathing? N  Doing errands, shopping? N  Preparing Food and eating ? N  Using the Toilet? N  In the past six months, have you accidently leaked urine? N  Do you have problems with loss of bowel control? N  Managing your Medications? N  Managing your Finances? N  Housekeeping or managing your Housekeeping? N  Some recent data might be hidden     Immunizations and Health Maintenance Immunization History  Administered Date(s) Administered  . Fluad Quad(high Dose 65+) 07/07/2019  . Influenza, High Dose Seasonal PF 11/07/2018  . Influenza-Unspecified 06/22/2016  . Pneumococcal Conjugate-13 08/11/2019   Health Maintenance Due  Topic Date Due  . PNA vac Low Risk Adult (1 of 2 - PCV13) 02/01/2018    Patient Care Team: Olin Hauser, DO as PCP - General (Family Medicine) Virgel Manifold, MD as Consulting Physician (Gastroenterology)  Indicate any recent Medical  Services you may have received from other than [facility] providers in the past year (date may be approximate).     Assessment:   This is a routine wellness examination for Monica Chapman.  Hearing/Vision screen No exam data present  Dietary issues and exercise activities discussed: Current Exercise Habits: The patient does not participate in regular exercise at present, Exercise limited by: None identified  Goals   None    Depression Screen PHQ 2/9 Scores 08/11/2019 02/20/2019 11/07/2018 09/10/2017 05/07/2017 03/26/2017 12/03/2016  PHQ - 2 Score 0 0 - 2 2 5 2   PHQ- 9 Score - 4 - 2 3 15 3   Exception Documentation - - Patient refusal - - - -    Fall Risk Fall Risk  08/11/2019 09/10/2017 12/03/2016 04/24/2016 02/14/2016  Falls in the past year? 0 No No No Yes  Number falls in past yr: 0 - - - 2 or more  Injury with Fall? 0 - - - No   FALL RISK PREVENTION PERTAINING TO THE HOME:  Any stairs in or around the home? Yes  If so, are there any without handrails? No   Home free of loose throw rugs in walkways, pet beds, electrical cords, etc? Yes  Adequate lighting in your home to reduce risk of falls? Yes   ASSISTIVE DEVICES UTILIZED TO PREVENT FALLS:  Life alert? No  Use of a cane, walker or w/c? No  Grab bars in the bathroom? No  Shower chair or bench in shower? No  Elevated toilet seat or a handicapped toilet? No    DME ORDERS:  DME order needed?  No   TIMED UP AND GO:  Was the test performed? Yes .  Length of time to ambulate 10 feet: 8 sec.   GAIT:  Appearance of gait: Gait steady and fast without the use of an assistive device. Education: Fall risk prevention has been discussed.  Intervention(s) required? No   DME/home health order needed?  No     Cognitive Function:     6CIT Screen 08/11/2019  What Year? 0 points  What month? 0 points  What time? 0 points  Count back from 20 0 points  Months in reverse 0 points  Repeat phrase 0 points  Total Score 0    Screening Tests  Health Maintenance  Topic Date Due  . PNA vac Low Risk Adult (1 of 2 - PCV13) 02/01/2018  . MAMMOGRAM  03/16/2021  . COLONOSCOPY  03/11/2022  . TETANUS/TDAP  02/02/2024  . INFLUENZA VACCINE  Completed  . DEXA SCAN  Completed  . Hepatitis C Screening  Completed    Qualifies for Shingles Vaccine? Yes  Zostavax completed n/a. Due for Shingrix. Education has been provided regarding the importance of this vaccine. Pt has been advised to call insurance company to determine out of pocket expense. Advised may also receive vaccine at local pharmacy or Health Dept. Verbalized acceptance and understanding.  Tdap: up to date   Flu Vaccine: up to date   Pneumococcal Vaccine: Due for Pneumococcal vaccine. Does the patient want to receive this vaccine today? yes  Cancer Screenings:  Colorectal Screening: Completed 03/12/2019. Repeat every 3 years  Mammogram: Completed 03/17/2019. Repeat every year  Bone Density: Completed 10/27/2014  Lung Cancer Screening: (Low Dose CT Chest recommended if Age 28-80 years, 30 pack-year currently smoking OR have quit w/in 15years.) does qualify.  Declined     Additional Screening:  Hepatitis C Screening: does qualify; Completed 2016  Vision Screening: Recommended annual ophthalmology exams for early detection of glaucoma and other disorders of the eye. Is the patient up to date with their annual eye exam?  Yes  Who is the provider or what is the name of the office in which the pt attends annual eye exams? [facility]    Dental Screening: Recommended annual dental exams for proper oral hygiene  Community Resource Referral:  CRR required this visit?  No       Plan:  I have personally reviewed and addressed the Medicare Annual Wellness questionnaire and have noted the following in the patient's chart:  A. Medical and social history B. Use of alcohol, tobacco or illicit drugs  C. Current medications and supplements D. Functional ability and  status E.  Nutritional status F.  Physical activity G. Advance directives H. List of other physicians I.  Hospitalizations, surgeries, and ER visits in previous 12 months J.  [place] such as hearing and vision if needed, cognitive and depression L. Referrals and appointments   In addition, I have reviewed and discussed with patient certain preventive protocols, quality metrics, and best practice recommendations. A written personalized care plan for preventive services as well as general preventive health recommendations were provided to patient.   Signed,    Bevelyn Ngo, LPN   [phone]  Nurse Health Advisor   Nurse Notes: requesting refill on hydrochlorothiazide. States she called pharmacy the other day but hasnt heard anything back. She will also schedule follow up appt as she is due for a follow up.

## 2019-08-19 ENCOUNTER — Other Ambulatory Visit: Payer: Self-pay | Admitting: Family Medicine

## 2019-08-19 DIAGNOSIS — E782 Mixed hyperlipidemia: Secondary | ICD-10-CM

## 2019-08-23 MED ORDER — ATORVASTATIN CALCIUM 10 MG PO TABS: 10.0000 mg | ORAL_TABLET | Freq: Every day | ORAL | 1 refills | Status: DC

## 2019-08-23 NOTE — Addendum Note (Signed)
Addended by: Olin Hauser on: 08/23/2019 09:22 PM   Modules accepted: Orders

## 2019-09-11 ENCOUNTER — Other Ambulatory Visit: Payer: Self-pay | Admitting: Family Medicine

## 2019-09-11 DIAGNOSIS — I10 Essential (primary) hypertension: Secondary | ICD-10-CM

## 2019-09-11 DIAGNOSIS — F5101 Primary insomnia: Secondary | ICD-10-CM

## 2019-09-11 DIAGNOSIS — F4322 Adjustment disorder with anxiety: Secondary | ICD-10-CM

## 2019-09-14 ENCOUNTER — Other Ambulatory Visit: Payer: Self-pay | Admitting: Family Medicine

## 2019-09-14 DIAGNOSIS — I10 Essential (primary) hypertension: Secondary | ICD-10-CM

## 2019-09-14 DIAGNOSIS — F5101 Primary insomnia: Secondary | ICD-10-CM

## 2019-09-14 MED ORDER — LOSARTAN POTASSIUM 100 MG PO TABS: 100.0000 mg | ORAL_TABLET | Freq: Every day | ORAL | 1 refills | Status: DC

## 2019-09-14 MED ORDER — ESCITALOPRAM OXALATE 10 MG PO TABS: 10.0000 mg | ORAL_TABLET | Freq: Every day | ORAL | 1 refills | Status: DC

## 2019-09-14 NOTE — Telephone Encounter (Signed)
Pt called said that she went to the drug store to pick up medication it was denied .She is requesting a call back [phone]

## 2019-09-23 ENCOUNTER — Other Ambulatory Visit: Payer: Self-pay | Admitting: Family Medicine

## 2019-09-23 DIAGNOSIS — M810 Age-related osteoporosis without current pathological fracture: Secondary | ICD-10-CM

## 2019-10-08 DIAGNOSIS — H35032 Hypertensive retinopathy, left eye: Secondary | ICD-10-CM | POA: Diagnosis not present

## 2019-10-08 DIAGNOSIS — H25813 Combined forms of age-related cataract, bilateral: Secondary | ICD-10-CM | POA: Diagnosis not present

## 2019-10-20 DIAGNOSIS — H25043 Posterior subcapsular polar age-related cataract, bilateral: Secondary | ICD-10-CM | POA: Diagnosis not present

## 2019-10-20 DIAGNOSIS — H18413 Arcus senilis, bilateral: Secondary | ICD-10-CM | POA: Diagnosis not present

## 2019-10-20 DIAGNOSIS — H2513 Age-related nuclear cataract, bilateral: Secondary | ICD-10-CM | POA: Diagnosis not present

## 2019-10-20 DIAGNOSIS — H25013 Cortical age-related cataract, bilateral: Secondary | ICD-10-CM | POA: Diagnosis not present

## 2019-10-20 DIAGNOSIS — H2512 Age-related nuclear cataract, left eye: Secondary | ICD-10-CM | POA: Diagnosis not present

## 2019-10-30 DIAGNOSIS — H25012 Cortical age-related cataract, left eye: Secondary | ICD-10-CM | POA: Diagnosis not present

## 2019-10-30 DIAGNOSIS — H2511 Age-related nuclear cataract, right eye: Secondary | ICD-10-CM | POA: Diagnosis not present

## 2019-10-30 DIAGNOSIS — Z9842 Cataract extraction status, left eye: Secondary | ICD-10-CM | POA: Diagnosis not present

## 2019-10-30 DIAGNOSIS — H2512 Age-related nuclear cataract, left eye: Secondary | ICD-10-CM | POA: Diagnosis not present

## 2019-10-30 DIAGNOSIS — Z961 Presence of intraocular lens: Secondary | ICD-10-CM | POA: Diagnosis not present

## 2019-11-08 IMAGING — MG DIGITAL SCREENING BILATERAL MAMMOGRAM WITH TOMO AND CAD
6 of 10 series · 6 of 30 positions shown · non-contrast
Comparison: Previous exam(s).

CLINICAL DATA: Screening.

EXAM:
DIGITAL SCREENING BILATERAL MAMMOGRAM WITH TOMO AND CAD

[R MLO synth-2D (1 of 2)]
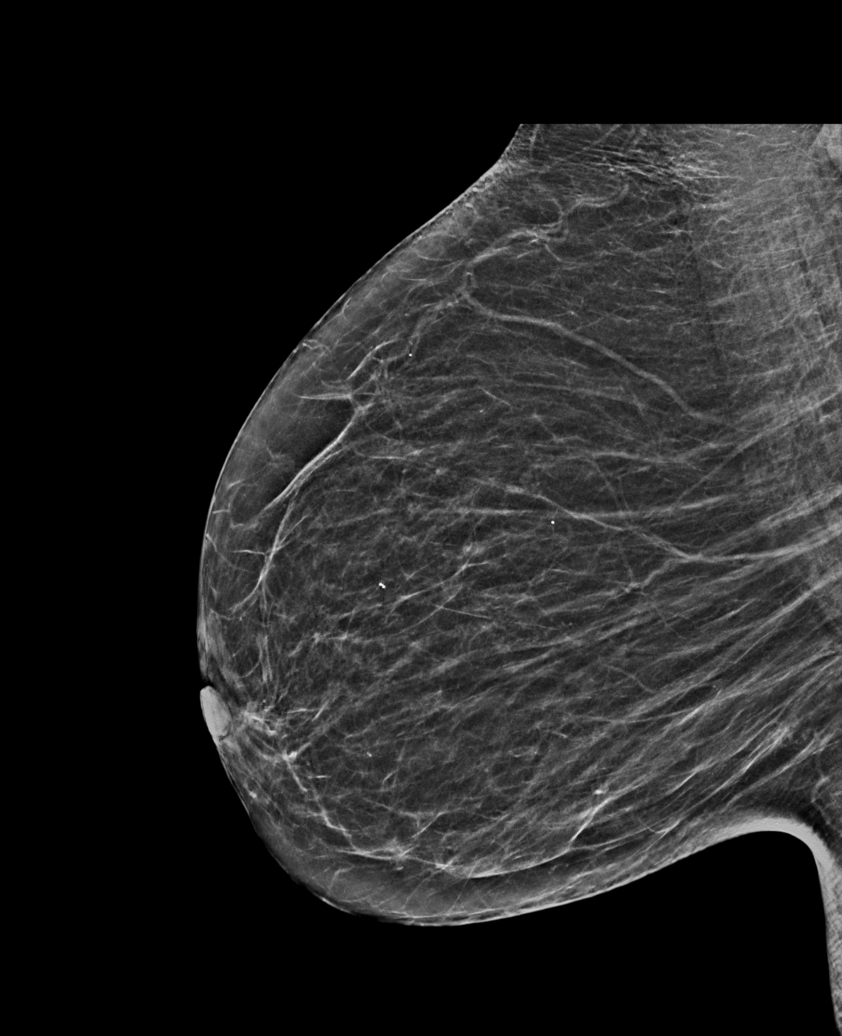

[R CC synth-2D]
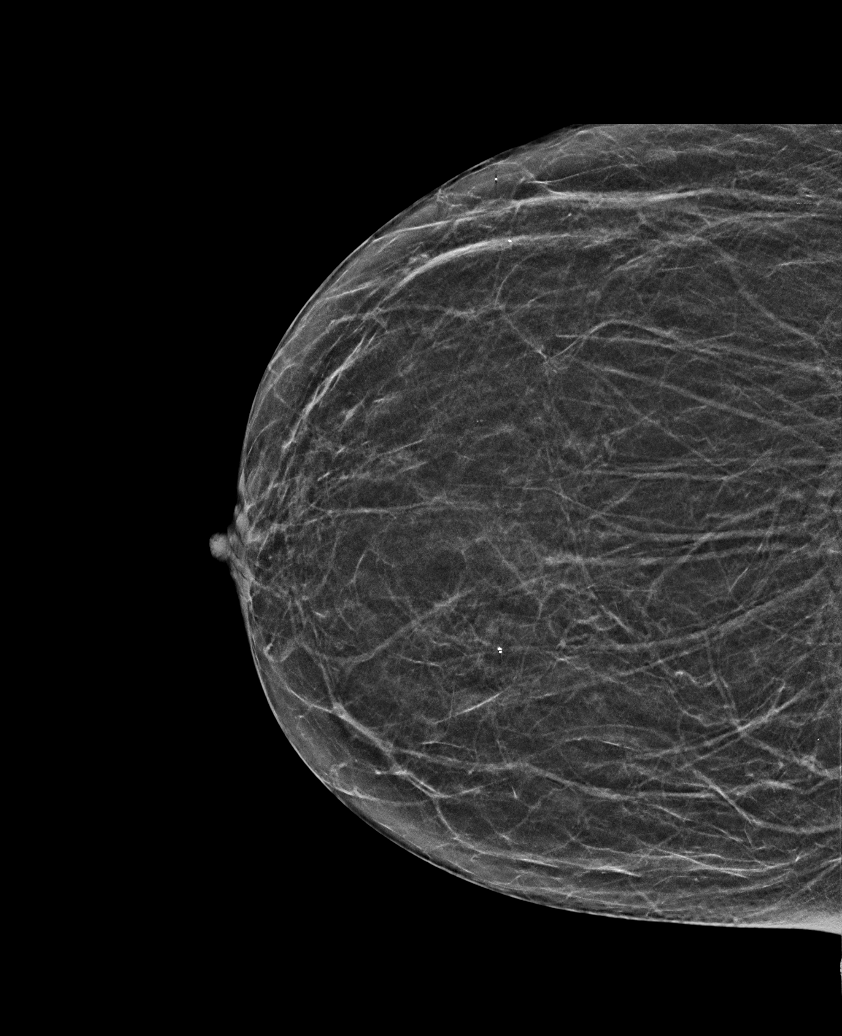

[R MLO synth-2D (2 of 2)]
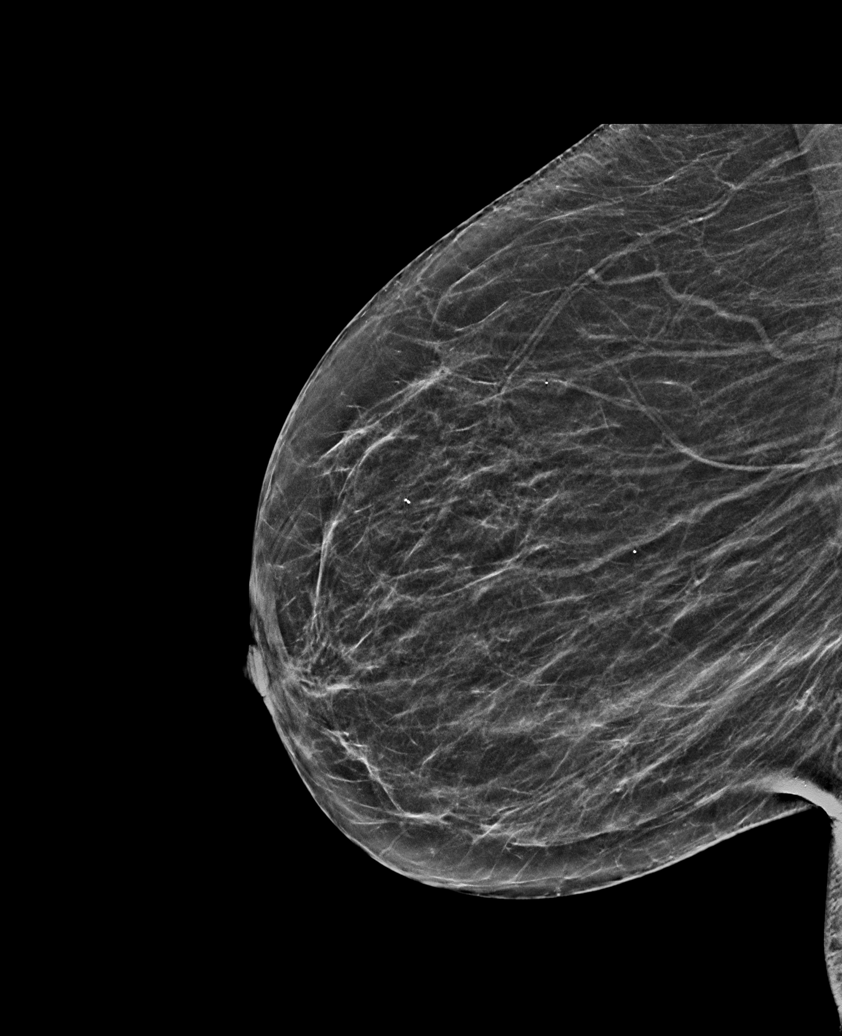

[L CC synth-2D]
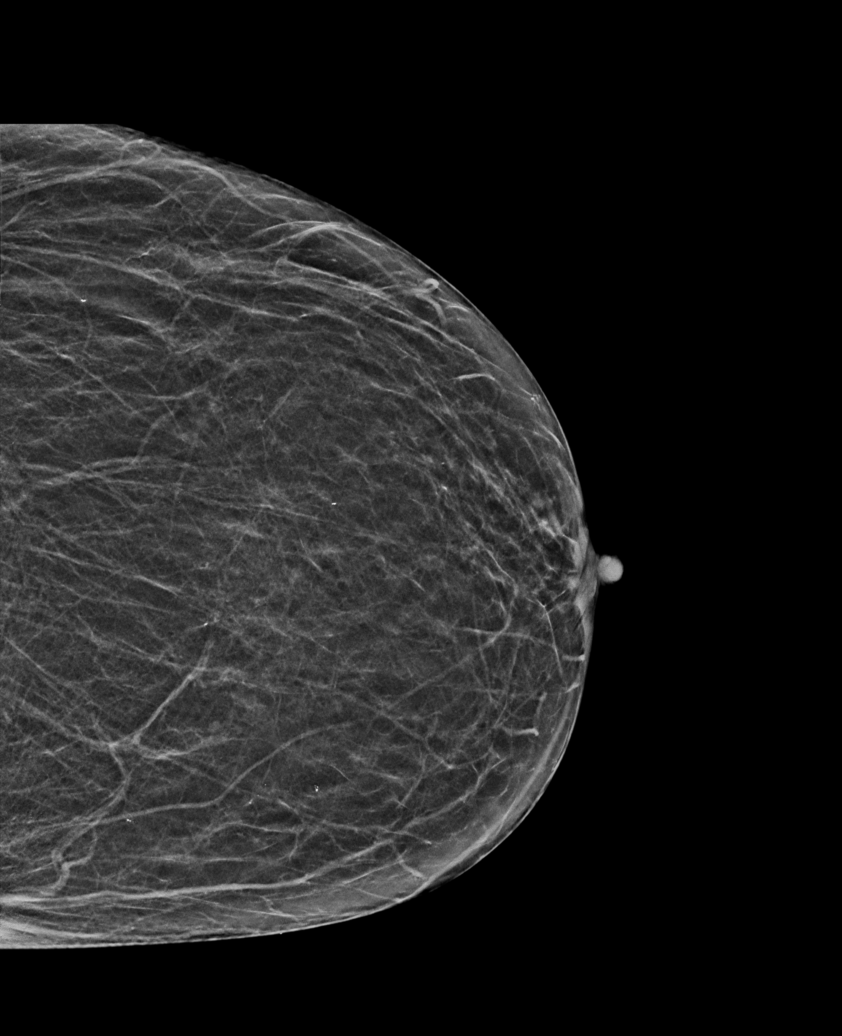

[L MLO synth-2D]
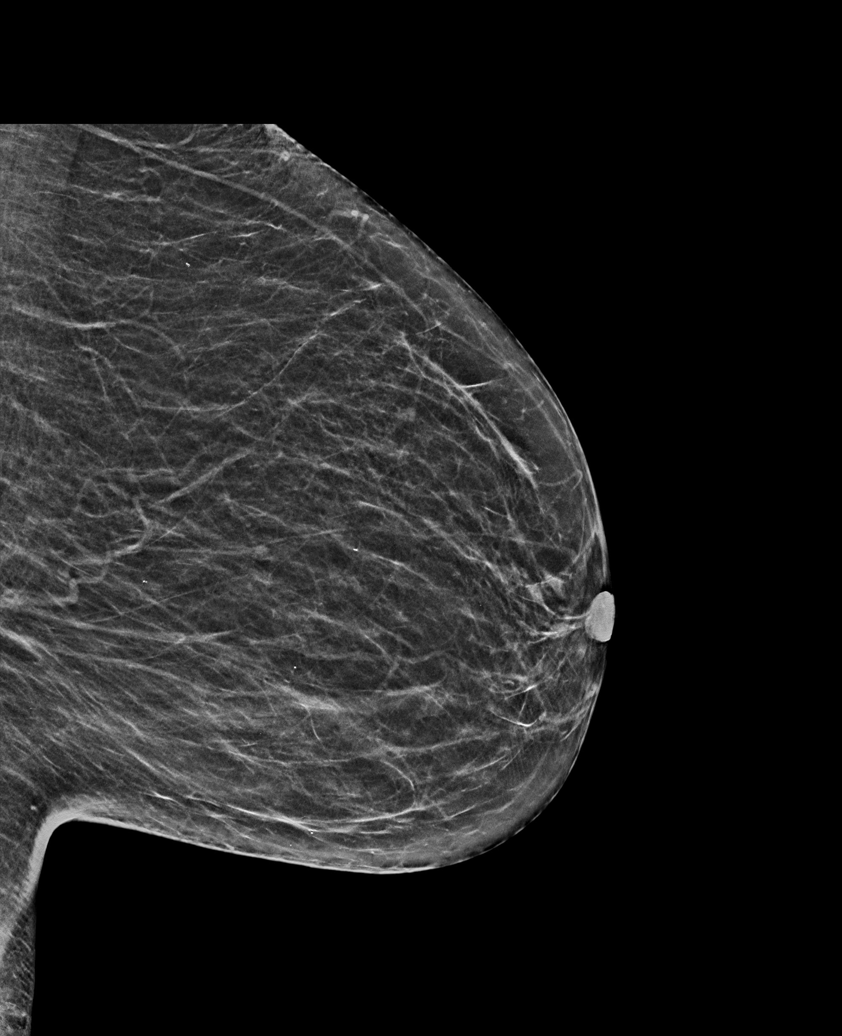

[R MLO tomo · tomo slice 27/53.0]
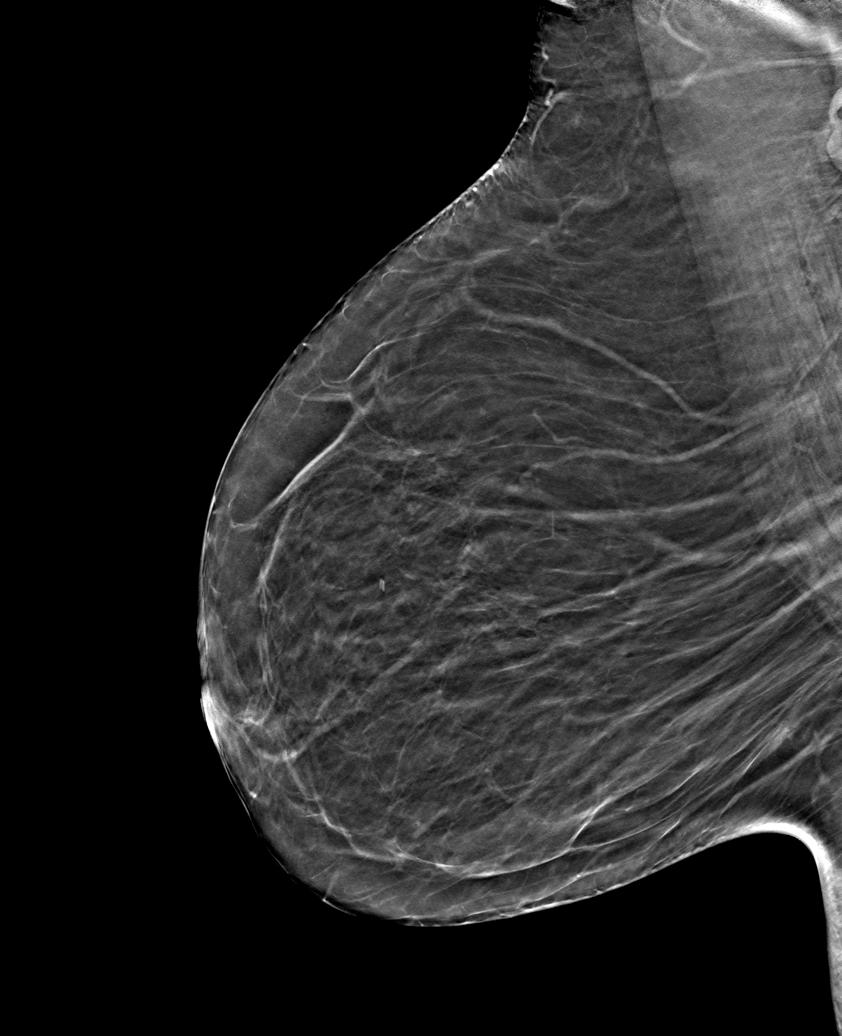

[6 of 30 positions shown; findings below may reference images not displayed]

ACR Breast Density Category b: There are scattered areas of
fibroglandular density.
FINDINGS: There are no findings suspicious for malignancy. Images were
processed with CAD.
IMPRESSION: No mammographic evidence of malignancy. A result letter of this
screening mammogram will be mailed directly to the patient.

RECOMMENDATION:
Screening mammogram in one year. (Code:[id number])

BI-RADS CATEGORY  1: Negative.

## 2019-11-27 DIAGNOSIS — Z9841 Cataract extraction status, right eye: Secondary | ICD-10-CM | POA: Diagnosis not present

## 2019-11-27 DIAGNOSIS — Z961 Presence of intraocular lens: Secondary | ICD-10-CM | POA: Diagnosis not present

## 2019-11-27 DIAGNOSIS — H25011 Cortical age-related cataract, right eye: Secondary | ICD-10-CM | POA: Diagnosis not present

## 2019-11-27 DIAGNOSIS — H2511 Age-related nuclear cataract, right eye: Secondary | ICD-10-CM | POA: Diagnosis not present

## 2019-11-27 DIAGNOSIS — Z9842 Cataract extraction status, left eye: Secondary | ICD-10-CM | POA: Diagnosis not present

## 2020-02-17 ENCOUNTER — Other Ambulatory Visit: Payer: Self-pay

## 2020-02-17 MED ORDER — OMEPRAZOLE 20 MG PO CPDR: 20.0000 mg | DELAYED_RELEASE_CAPSULE | Freq: Every day | ORAL | 1 refills | Status: DC

## 2020-02-19 ENCOUNTER — Telehealth: Payer: Self-pay

## 2020-02-19 DIAGNOSIS — E559 Vitamin D deficiency, unspecified: Secondary | ICD-10-CM

## 2020-02-19 DIAGNOSIS — R7309 Other abnormal glucose: Secondary | ICD-10-CM

## 2020-02-19 DIAGNOSIS — I10 Essential (primary) hypertension: Secondary | ICD-10-CM

## 2020-02-19 NOTE — Telephone Encounter (Signed)
Signed [facility] orders

## 2020-02-23 DIAGNOSIS — R7309 Other abnormal glucose: Secondary | ICD-10-CM | POA: Diagnosis not present

## 2020-02-23 DIAGNOSIS — Z0000 Encounter for general adult medical examination without abnormal findings: Secondary | ICD-10-CM | POA: Diagnosis not present

## 2020-02-23 DIAGNOSIS — I10 Essential (primary) hypertension: Secondary | ICD-10-CM | POA: Diagnosis not present

## 2020-02-23 DIAGNOSIS — E559 Vitamin D deficiency, unspecified: Secondary | ICD-10-CM | POA: Diagnosis not present

## 2020-02-24 LAB — COMPREHENSIVE METABOLIC PANEL
ALT: 66 IU/L — ABNORMAL HIGH (ref 0–32)
AST: 90 IU/L — ABNORMAL HIGH (ref 0–40)
Albumin/Globulin Ratio: 2.2 (ref 1.2–2.2)
Albumin: 4.8 g/dL (ref 3.8–4.8)
Alkaline Phosphatase: 72 IU/L (ref 48–121)
BUN/Creatinine Ratio: 19 (ref 12–28)
BUN: 16 mg/dL (ref 8–27)
Bilirubin Total: 0.5 mg/dL (ref 0.0–1.2)
CO2: 26 mmol/L (ref 20–29)
Calcium: 9.9 mg/dL (ref 8.7–10.3)
Chloride: 89 mmol/L — ABNORMAL LOW (ref 96–106)
Creatinine, Ser: 0.86 mg/dL (ref 0.57–1.00)
GFR calc Af Amer: 81 mL/min/{1.73_m2} (ref >59)
GFR calc non Af Amer: 70 mL/min/{1.73_m2} (ref >59)
Globulin, Total: 2.2 g/dL (ref 1.5–4.5)
Glucose: 125 mg/dL — ABNORMAL HIGH (ref 65–99)
Potassium: 4.8 mmol/L (ref 3.5–5.2)
Sodium: 128 mmol/L — ABNORMAL LOW (ref 134–144)
Total Protein: 7 g/dL (ref 6.0–8.5)

## 2020-02-24 LAB — TSH: TSH: 1.49 u[IU]/mL (ref 0.450–4.500)

## 2020-02-24 LAB — CBC WITH DIFFERENTIAL/PLATELET
Basophils Absolute: 0 10*3/uL (ref 0.0–0.2)
Basos: 1 %
EOS (ABSOLUTE): 0.1 10*3/uL (ref 0.0–0.4)
Eos: 2 %
Hematocrit: 36.5 % (ref 34.0–46.6)
Hemoglobin: 12.9 g/dL (ref 11.1–15.9)
Immature Grans (Abs): 0 10*3/uL (ref 0.0–0.1)
Immature Granulocytes: 1 %
Lymphocytes Absolute: 1.2 10*3/uL (ref 0.7–3.1)
Lymphs: 21 %
MCH: 35.9 pg — ABNORMAL HIGH (ref 26.6–33.0)
MCHC: 35.3 g/dL (ref 31.5–35.7)
MCV: 102 fL — ABNORMAL HIGH (ref 79–97)
Monocytes Absolute: 0.5 10*3/uL (ref 0.1–0.9)
Monocytes: 10 %
Neutrophils Absolute: 3.6 10*3/uL (ref 1.4–7.0)
Neutrophils: 65 %
Platelets: 246 10*3/uL (ref 150–450)
RBC: 3.59 x10E6/uL — ABNORMAL LOW (ref 3.77–5.28)
RDW: 12 % (ref 11.7–15.4)
WBC: 5.5 10*3/uL (ref 3.4–10.8)

## 2020-02-24 LAB — VITAMIN D 25 HYDROXY (VIT D DEFICIENCY, FRACTURES): Vit D, 25-Hydroxy: 78.1 ng/mL (ref 30.0–100.0)

## 2020-02-24 LAB — LIPID PANEL
Chol/HDL Ratio: 1.9 ratio (ref 0.0–4.4)
Cholesterol, Total: 202 mg/dL — ABNORMAL HIGH (ref 100–199)
HDL: 105 mg/dL (ref >39)
LDL Chol Calc (NIH): 82 mg/dL (ref 0–99)
Triglycerides: 85 mg/dL (ref 0–149)
VLDL Cholesterol Cal: 15 mg/dL (ref 5–40)

## 2020-02-24 LAB — HEMOGLOBIN A1C
Est. average glucose Bld gHb Est-mCnc: 97 mg/dL
Hgb A1c MFr Bld: 5 % (ref 4.8–5.6)

## 2020-02-26 ENCOUNTER — Other Ambulatory Visit: Payer: Self-pay

## 2020-02-26 ENCOUNTER — Encounter: Payer: Self-pay | Admitting: Family Medicine

## 2020-02-26 ENCOUNTER — Ambulatory Visit (INDEPENDENT_AMBULATORY_CARE_PROVIDER_SITE_OTHER): Payer: Medicare Other | Admitting: Family Medicine

## 2020-02-26 VITALS — BP 93/45 | HR 74 | Temp 97.8°F | Resp 16 | Ht 66.0 in | Wt 138.0 lb

## 2020-02-26 DIAGNOSIS — E871 Hypo-osmolality and hyponatremia: Secondary | ICD-10-CM

## 2020-02-26 DIAGNOSIS — F4322 Adjustment disorder with anxiety: Secondary | ICD-10-CM | POA: Diagnosis not present

## 2020-02-26 DIAGNOSIS — R634 Abnormal weight loss: Secondary | ICD-10-CM

## 2020-02-26 DIAGNOSIS — Z789 Other specified health status: Secondary | ICD-10-CM

## 2020-02-26 DIAGNOSIS — Z7289 Other problems related to lifestyle: Secondary | ICD-10-CM | POA: Diagnosis not present

## 2020-02-26 DIAGNOSIS — E782 Mixed hyperlipidemia: Secondary | ICD-10-CM

## 2020-02-26 DIAGNOSIS — I10 Essential (primary) hypertension: Secondary | ICD-10-CM | POA: Diagnosis not present

## 2020-02-26 DIAGNOSIS — F1090 Alcohol use, unspecified, uncomplicated: Secondary | ICD-10-CM

## 2020-02-26 NOTE — Patient Instructions (Addendum)
Thank you for coming to the office today.  Discontinue Hydrochlorothiazide 25mg  daily - this is a BP medication, fluid pill likely lowering your sodium and can make you dizzy.  Try to reduce alcohol intake if possible off completely, can gradually reduce, OR can still consume max 1 shot or 1 drink equivalent some days, not every day.  1. Chemistry - mild low sodium 128 and low chloride 89, elevated liver enzymes AST 90 and ALT 66  2. Hemoglobin A1c (Diabetes screening) - A1c 5.0 normal not in range of Pre-Diabetes (>5.7 to 6.4)   3. TSH Thyroid Function Tests - 1.490, Normal.  Vitamin D 78.  4. Cholesterol - mostly normal. On atorvastatin  5. CBC Blood Counts - mild elevated MCV otherwise Normal, no anemia, other abnormality  --------------------  DUE for FASTING BLOOD WORK (no food or drink after midnight before the lab appointment, only water or coffee without cream/sugar on the morning of)  SCHEDULE "Lab Only" visit in the morning at the clinic for lab draw in 3 MONTHS   - Make sure Lab Only appointment is at about 1 week before your next appointment, so that results will be available  For Lab Results, once available within 2-3 days of blood draw, you can can log in to MyChart online to view your results and a brief explanation. Also, we can discuss results at next follow-up visit.   Please schedule a Follow-up Appointment to: Return in about 3 months (around 05/28/2020) for 3 month follow-up HTN dizzy, weight sodium.  If you have any other questions or concerns, please feel free to call the office or send a message through [facility]. You may also schedule an earlier appointment if necessary.  Additionally, you may be receiving a survey about your experience at our office within a few days to 1 week by e-mail or mail. We value your feedback.  Nobie Putnam, DO [place]

## 2020-02-26 NOTE — Progress Notes (Signed)
Subjective:    Patient ID: Monica Chapman, female    DOB: September 08, 1951, 69 y.o.   MRN: [id number]  Monica Chapman is a 69 y.o. female presenting on 02/26/2020 for Dizziness (B/P ranges on low side ---she gets dizzy sometimes but denies HA) and Hypertension   HPI   Yearly evaluation and lab review.  Hyponatremia Alcohol consumption Last lab mild low Na Drink 16 oz alcohol daily Drinks alcohol 2 shots vodka alcohol  Weight Loss Stable around 140 lbs. Has seen GI, had endoscopy and work up no other cause identified, treated GERD No CT done at that time  Mild low BP / dizziness - is it med side effect. She is taking HCTZ 25mg  daily, no edema and BP low normal  Health Maintenance:  UTD Mammogram  Per GI Repeat EGD in 3 years from June 2020  Repeat colonoscopy in 3 years from June 2020  Depression screen [facility] 2/9 02/26/2020 08/11/2019 02/20/2019  Decreased Interest 0 0 0  Down, Depressed, Hopeless 0 0 0  PHQ - 2 Score 0 0 0  Altered sleeping - - 0  Tired, decreased energy - - 1  Change in appetite - - 3  Feeling bad or failure about yourself  - - 0  Trouble concentrating - - 0  Moving slowly or fidgety/restless - - 0  Suicidal thoughts - - 0  PHQ-9 Score - - 4  Difficult doing work/chores - - Not difficult at all   GAD 7 : Generalized Anxiety Score 02/20/2019 11/07/2018 05/07/2017 03/26/2017  Nervous, Anxious, on Edge 1 (No Data) 2 3  Control/stop worrying 0 - 1 3  Worry too much - different things 0 - 2 2  Trouble relaxing 0 - 2 2  Restless 0 - 1 2  Easily annoyed or irritable 0 - 0 1  Afraid - awful might happen 0 - 0 3  Total GAD 7 Score 1 - 8 16  Anxiety Difficulty Not difficult at all - Not difficult at all Somewhat difficult     Past Medical History:  Diagnosis Date  . Arthritis   . Barrett esophagus   . Hypertension    Past Surgical History:  Procedure Laterality Date  . ABDOMINAL HYSTERECTOMY    . COLONOSCOPY WITH PROPOFOL N/A 03/12/2019   Procedure:  COLONOSCOPY WITH PROPOFOL;  Surgeon: Virgel Manifold, MD;  Location: [facility] ENDOSCOPY;  Service: Endoscopy;  Laterality: N/A;  . ESOPHAGOGASTRODUODENOSCOPY (EGD) WITH PROPOFOL N/A 03/12/2019   Procedure: ESOPHAGOGASTRODUODENOSCOPY (EGD) WITH PROPOFOL;  Surgeon: Virgel Manifold, MD;  Location: [facility] ENDOSCOPY;  Service: Endoscopy;  Laterality: N/A;  . FOOT SURGERY    . HAND SURGERY     Social History   Socioeconomic History  . Marital status: Married    Spouse name: Not on file  . Number of children: Not on file  . Years of education: Not on file  . Highest education level: High school graduate  Occupational History  . Not on file  Tobacco Use  . Smoking status: Current Every Day Smoker    Packs/day: 0.75    Types: Cigarettes  . Smokeless tobacco: Current User  . Tobacco comment: quit in 2017, restarted.   Substance and Sexual Activity  . Alcohol use: Yes    Alcohol/week: 21.0 standard drinks    Types: 21 Glasses of wine per week    Comment: 3 glasses wine daily  . Drug use: Yes    Types: Marijuana    Comment: occasional   .  Sexual activity: Not on file  Other Topics Concern  . Not on file  Social History Narrative  . Not on file   Social Determinants of Health   Financial Resource Strain: Low Risk   . Difficulty of Paying Living Expenses: Not hard at all  Food Insecurity: No Food Insecurity  . Worried About Charity fundraiser in the Last Year: Never true  . Ran Out of Food in the Last Year: Never true  Transportation Needs: No Transportation Needs  . Lack of Transportation (Medical): No  . Lack of Transportation (Non-Medical): No  Physical Activity: Inactive  . Days of Exercise per Week: 0 days  . Minutes of Exercise per Session: 0 min  Stress: No Stress Concern Present  . Feeling of Stress : Not at all  Social Connections: Somewhat Isolated  . Frequency of Communication with Friends and Family: More than three times a week  . Frequency of Social Gatherings  with Friends and Family: Once a week  . Attends Religious Services: Never  . Active Member of Clubs or Organizations: No  . Attends Archivist Meetings: Never  . Marital Status: Married  Human resources officer Violence: Not At Risk  . Fear of Current or Ex-Partner: No  . Emotionally Abused: No  . Physically Abused: No  . Sexually Abused: No   Family History  Problem Relation Age of Onset  . Breast cancer Mother 7  . Breast cancer Maternal Grandmother    Current Outpatient Medications on File Prior to Visit  Medication Sig  . alendronate (FOSAMAX) 70 MG tablet TAKE 1 TABLET(70 MG) BY MOUTH 1 TIME A WEEK WITH A FULL GLASS OF WATER AND ON AN EMPTY STOMACH  . atorvastatin (LIPITOR) 10 MG tablet Take 1 tablet (10 mg total) by mouth daily at 6 PM.  . calcium-vitamin D (OSCAL WITH D) 500-200 MG-UNIT per tablet Take 1 tablet by mouth.  . Cetirizine HCl 10 MG CAPS Take 10 mg by mouth at bedtime.   Marland Kitchen escitalopram (LEXAPRO) 10 MG tablet Take 1 tablet (10 mg total) by mouth daily.  Marland Kitchen losartan (COZAAR) 100 MG tablet Take 1 tablet (100 mg total) by mouth daily.  . Melatonin 3 MG TABS Take by mouth as needed.  Marland Kitchen omeprazole (PRILOSEC) 20 MG capsule Take 1 capsule (20 mg total) by mouth daily.   No current facility-administered medications on file prior to visit.    Review of Systems  Constitutional: Negative for activity change, appetite change, chills, diaphoresis, fatigue and fever.  HENT: Negative for congestion and hearing loss.   Eyes: Negative for visual disturbance.  Respiratory: Negative for cough, chest tightness, shortness of breath and wheezing.   Cardiovascular: Negative for chest pain, palpitations and leg swelling.  Gastrointestinal: Negative for abdominal pain, constipation, diarrhea, nausea and vomiting.  Endocrine: Negative for cold intolerance.  Genitourinary: Negative for dysuria, frequency and hematuria.  Musculoskeletal: Negative for arthralgias and neck pain.  Skin:  Negative for rash.  Allergic/Immunologic: Negative for environmental allergies.  Neurological: Negative for dizziness, weakness, light-headedness, numbness and headaches.  Hematological: Negative for adenopathy.  Psychiatric/Behavioral: Negative for behavioral problems, dysphoric mood and sleep disturbance.   Per HPI unless specifically indicated above      Objective:    BP (!) 93/45   Pulse 74   Temp 97.8 F (36.6 C) (Temporal)   Resp 16   Ht 5\' 6"  (1.676 m)   Wt 138 lb (62.6 kg)   SpO2 99%   BMI 22.27 kg/m  Wt Readings from Last 3 Encounters:  02/26/20 138 lb (62.6 kg)  08/11/19 149 lb 9.6 oz (67.9 kg)  04/28/19 157 lb (71.2 kg)    Physical Exam Vitals and nursing note reviewed.  Constitutional:      General: She is not in acute distress.    Appearance: She is well-developed. She is not diaphoretic.     Comments: Well-appearing, comfortable, cooperative  HENT:     Head: Normocephalic and atraumatic.  Eyes:     General:        Right eye: No discharge.        Left eye: No discharge.     Conjunctiva/sclera: Conjunctivae normal.     Pupils: Pupils are equal, round, and reactive to light.  Neck:     Thyroid: No thyromegaly.  Cardiovascular:     Rate and Rhythm: Normal rate and regular rhythm.     Heart sounds: Normal heart sounds. No murmur.  Pulmonary:     Effort: Pulmonary effort is normal. No respiratory distress.     Breath sounds: Normal breath sounds. No wheezing or rales.  Abdominal:     General: Bowel sounds are normal. There is no distension.     Palpations: Abdomen is soft. There is no mass.     Tenderness: There is no abdominal tenderness.  Musculoskeletal:        General: No tenderness. Normal range of motion.     Cervical back: Normal range of motion and neck supple.     Comments: Upper / Lower Extremities: - Normal muscle tone, strength bilateral upper extremities 5/5, lower extremities 5/5  Lymphadenopathy:     Cervical: No cervical adenopathy.    Skin:    General: Skin is warm and dry.     Findings: No erythema or rash.  Neurological:     Mental Status: She is alert and oriented to person, place, and time.     Comments: Distal sensation intact to light touch all extremities  Psychiatric:        Behavior: Behavior normal.     Comments: Well groomed, good eye contact, normal speech and thoughts        Results for orders placed or performed in visit on 02/19/20  Comprehensive Metabolic Panel (CMET)  Result Value Ref Range   Glucose 125 (H) 65 - 99 mg/dL   BUN 16 8 - 27 mg/dL   Creatinine, Ser 0.86 0.57 - 1.00 mg/dL   GFR calc non Af Amer 70 >59 mL/min/1.73   GFR calc Af Amer 81 >59 mL/min/1.73   BUN/Creatinine Ratio 19 12 - 28   Sodium 128 (L) 134 - 144 mmol/L   Potassium 4.8 3.5 - 5.2 mmol/L   Chloride 89 (L) 96 - 106 mmol/L   CO2 26 20 - 29 mmol/L   Calcium 9.9 8.7 - 10.3 mg/dL   Total Protein 7.0 6.0 - 8.5 g/dL   Albumin 4.8 3.8 - 4.8 g/dL   Globulin, Total 2.2 1.5 - 4.5 g/dL   Albumin/Globulin Ratio 2.2 1.2 - 2.2   Bilirubin Total 0.5 0.0 - 1.2 mg/dL   Alkaline Phosphatase 72 48 - 121 IU/L   AST 90 (H) 0 - 40 IU/L   ALT 66 (H) 0 - 32 IU/L  CBC with Differential/Platelet  Result Value Ref Range   WBC 5.5 3.4 - 10.8 x10E3/uL   RBC 3.59 (L) 3.77 - 5.28 x10E6/uL   Hemoglobin 12.9 11.1 - 15.9 g/dL   Hematocrit 36.5 34.0 - 46.6 %  MCV 102 (H) 79 - 97 fL   MCH 35.9 (H) 26.6 - 33.0 pg   MCHC 35.3 31.5 - 35.7 g/dL   RDW 12.0 11.7 - 15.4 %   Platelets 246 150 - 450 x10E3/uL   Neutrophils 65 Not Estab. %   Lymphs 21 Not Estab. %   Monocytes 10 Not Estab. %   Eos 2 Not Estab. %   Basos 1 Not Estab. %   Neutrophils Absolute 3.6 1.4 - 7.0 x10E3/uL   Lymphocytes Absolute 1.2 0.7 - 3.1 x10E3/uL   Monocytes Absolute 0.5 0.1 - 0.9 x10E3/uL   EOS (ABSOLUTE) 0.1 0.0 - 0.4 x10E3/uL   Basophils Absolute 0.0 0.0 - 0.2 x10E3/uL   Immature Granulocytes 1 Not Estab. %   Immature Grans (Abs) 0.0 0.0 - 0.1 x10E3/uL   Hemoglobin A1c  Result Value Ref Range   Hgb A1c MFr Bld 5.0 4.8 - 5.6 %   Est. average glucose Bld gHb Est-mCnc 97 mg/dL  Lipid panel  Result Value Ref Range   Cholesterol, Total 202 (H) 100 - 199 mg/dL   Triglycerides 85 0 - 149 mg/dL   HDL 105 >39 mg/dL   VLDL Cholesterol Cal 15 5 - 40 mg/dL   LDL Chol Calc (NIH) 82 0 - 99 mg/dL   Chol/HDL Ratio 1.9 0.0 - 4.4 ratio  TSH  Result Value Ref Range   TSH 1.490 0.450 - 4.500 uIU/mL  VITAMIN D 25 Hydroxy (Vit-D Deficiency, Fractures)  Result Value Ref Range   Vit D, 25-Hydroxy 78.1 30.0 - 100.0 ng/mL      Assessment & Plan:   Problem List Items Addressed This Visit    Persistent adjustment disorder with anxiety   Loss of weight   Hyperlipidemia   Essential hypertension - Primary   Relevant Orders   CBC with Differential/Platelet   COMPLETE METABOLIC PANEL WITH GFR    Other Visit Diagnoses    Hyponatremia       Relevant Orders   COMPLETE METABOLIC PANEL WITH GFR   Alcohol use          #Alcohol Hyponatremia Reduce alcohol intake as discussed safely taper down and quit or reduce amount Follow-up labs in 3 months  #Hypertension Now has low BP, due to weight loss and other factors DC HCTZ due to hyponatremia, not needed for BP Monitor Pressures  #Hyperlipidemia Review diet lifestyle monitoring Follow-up  #Weight Loss Stable, improved appetite No further GI etiology per GI work up Future reconsider return to GI and CT  No orders of the defined types were placed in this encounter.     Follow up plan: Return in about 3 months (around 05/28/2020) for 3 month follow-up HTN dizzy, weight sodium.   Future recheck CMET CBC in 3 months.  Nobie Putnam, [facility] 02/26/2020, 11:24 AM

## 2020-03-02 ENCOUNTER — Ambulatory Visit: Payer: Federal, State, Local not specified - PPO | Admitting: Dermatology

## 2020-04-25 ENCOUNTER — Other Ambulatory Visit: Payer: Self-pay | Admitting: Family Medicine

## 2020-04-25 DIAGNOSIS — Z1231 Encounter for screening mammogram for malignant neoplasm of breast: Secondary | ICD-10-CM

## 2020-05-11 ENCOUNTER — Ambulatory Visit
Admission: RE | Admit: 2020-05-11 | Discharge: 2020-05-11 | Disposition: A | Discharge status: Home / Self Care | Payer: Medicare Other | Source: Ambulatory Visit | Attending: Family Medicine | Admitting: Family Medicine

## 2020-05-11 ENCOUNTER — Other Ambulatory Visit: Payer: Self-pay

## 2020-05-11 DIAGNOSIS — Z1231 Encounter for screening mammogram for malignant neoplasm of breast: Secondary | ICD-10-CM

## 2020-05-24 ENCOUNTER — Other Ambulatory Visit: Payer: Self-pay | Admitting: Family Medicine

## 2020-05-24 DIAGNOSIS — E871 Hypo-osmolality and hyponatremia: Secondary | ICD-10-CM

## 2020-05-24 DIAGNOSIS — I10 Essential (primary) hypertension: Secondary | ICD-10-CM

## 2020-05-24 DIAGNOSIS — R634 Abnormal weight loss: Secondary | ICD-10-CM

## 2020-05-24 NOTE — Progress Notes (Signed)
Signed orders.

## 2020-05-25 DIAGNOSIS — R634 Abnormal weight loss: Secondary | ICD-10-CM | POA: Diagnosis not present

## 2020-05-25 DIAGNOSIS — E871 Hypo-osmolality and hyponatremia: Secondary | ICD-10-CM | POA: Diagnosis not present

## 2020-05-25 DIAGNOSIS — I10 Essential (primary) hypertension: Secondary | ICD-10-CM | POA: Diagnosis not present

## 2020-05-26 LAB — COMPREHENSIVE METABOLIC PANEL
ALT: 26 IU/L (ref 0–32)
AST: 25 IU/L (ref 0–40)
Albumin/Globulin Ratio: 1.6 (ref 1.2–2.2)
Albumin: 4.4 g/dL (ref 3.8–4.8)
Alkaline Phosphatase: 69 IU/L (ref 48–121)
BUN/Creatinine Ratio: 30 — ABNORMAL HIGH (ref 12–28)
BUN: 21 mg/dL (ref 8–27)
Bilirubin Total: 0.4 mg/dL (ref 0.0–1.2)
CO2: 24 mmol/L (ref 20–29)
Calcium: 9.7 mg/dL (ref 8.7–10.3)
Chloride: 100 mmol/L (ref 96–106)
Creatinine, Ser: 0.7 mg/dL (ref 0.57–1.00)
GFR calc Af Amer: 104 mL/min/{1.73_m2} (ref >59)
GFR calc non Af Amer: 90 mL/min/{1.73_m2} (ref >59)
Globulin, Total: 2.7 g/dL (ref 1.5–4.5)
Glucose: 103 mg/dL — ABNORMAL HIGH (ref 65–99)
Potassium: 5.2 mmol/L (ref 3.5–5.2)
Sodium: 136 mmol/L (ref 134–144)
Total Protein: 7.1 g/dL (ref 6.0–8.5)

## 2020-05-26 LAB — CBC WITH DIFFERENTIAL/PLATELET
Basophils Absolute: 0 10*3/uL (ref 0.0–0.2)
Basos: 1 %
EOS (ABSOLUTE): 0.1 10*3/uL (ref 0.0–0.4)
Eos: 1 %
Hematocrit: 37.4 % (ref 34.0–46.6)
Hemoglobin: 12.9 g/dL (ref 11.1–15.9)
Immature Grans (Abs): 0 10*3/uL (ref 0.0–0.1)
Immature Granulocytes: 0 %
Lymphocytes Absolute: 2 10*3/uL (ref 0.7–3.1)
Lymphs: 25 %
MCH: 35.1 pg — ABNORMAL HIGH (ref 26.6–33.0)
MCHC: 34.5 g/dL (ref 31.5–35.7)
MCV: 102 fL — ABNORMAL HIGH (ref 79–97)
Monocytes Absolute: 0.5 10*3/uL (ref 0.1–0.9)
Monocytes: 7 %
Neutrophils Absolute: 5.4 10*3/uL (ref 1.4–7.0)
Neutrophils: 66 %
Platelets: 282 10*3/uL (ref 150–450)
RBC: 3.68 x10E6/uL — ABNORMAL LOW (ref 3.77–5.28)
RDW: 11.3 % — ABNORMAL LOW (ref 11.7–15.4)
WBC: 8.1 10*3/uL (ref 3.4–10.8)

## 2020-05-31 ENCOUNTER — Other Ambulatory Visit: Payer: Self-pay | Admitting: Family Medicine

## 2020-05-31 ENCOUNTER — Encounter: Payer: Self-pay | Admitting: Family Medicine

## 2020-05-31 ENCOUNTER — Ambulatory Visit (INDEPENDENT_AMBULATORY_CARE_PROVIDER_SITE_OTHER): Payer: Medicare Other | Admitting: Family Medicine

## 2020-05-31 ENCOUNTER — Other Ambulatory Visit: Payer: Self-pay

## 2020-05-31 VITALS — BP 108/60 | HR 65 | Temp 98.0°F | Resp 16 | Ht 66.0 in | Wt 133.6 lb

## 2020-05-31 DIAGNOSIS — Z789 Other specified health status: Secondary | ICD-10-CM

## 2020-05-31 DIAGNOSIS — E782 Mixed hyperlipidemia: Secondary | ICD-10-CM

## 2020-05-31 DIAGNOSIS — E871 Hypo-osmolality and hyponatremia: Secondary | ICD-10-CM | POA: Diagnosis not present

## 2020-05-31 DIAGNOSIS — E559 Vitamin D deficiency, unspecified: Secondary | ICD-10-CM

## 2020-05-31 DIAGNOSIS — I10 Essential (primary) hypertension: Secondary | ICD-10-CM

## 2020-05-31 DIAGNOSIS — Z7289 Other problems related to lifestyle: Secondary | ICD-10-CM

## 2020-05-31 DIAGNOSIS — F1090 Alcohol use, unspecified, uncomplicated: Secondary | ICD-10-CM

## 2020-05-31 DIAGNOSIS — R634 Abnormal weight loss: Secondary | ICD-10-CM | POA: Diagnosis not present

## 2020-05-31 DIAGNOSIS — R7309 Other abnormal glucose: Secondary | ICD-10-CM

## 2020-05-31 DIAGNOSIS — Z23 Encounter for immunization: Secondary | ICD-10-CM | POA: Diagnosis not present

## 2020-05-31 MED ORDER — LOSARTAN POTASSIUM 50 MG PO TABS: 50.0000 mg | ORAL_TABLET | Freq: Every day | ORAL | 3 refills | Status: DC

## 2020-05-31 NOTE — Progress Notes (Signed)
Subjective:    Patient ID: Monica Chapman, female    DOB: 14-Sep-1951, 69 y.o.   MRN: [id number]  Monica Chapman is a 69 y.o. female presenting on 05/31/2020 for Hypertension   HPI   Hypertension Hyponatremia - RESOLVED Weight loss  Labs are improved, resolved hyponatremia, normal chemistry function. Last visit 3 months ago, she was taken off HCTZ due to Hyponatremia, and low BP with weight loss Not checking BP at home. She is doing well. Taking Losartan 100mg  daily still Appetite is good, eats mostly meat and plenty of vegetables, increasing pasta and starches Weight down 4 lbs in 3 months, overall fairly steady compared to prior Has seen GI, had endoscopy and work up no other cause identified, treated GERD Resolved dizziness  Alcohol consumption / Elevated LFT Last lab showed improved LFTs - Reduced down to 4 days a week, instead of 7 days - She will drink wine one day a week, and usually liquor the other 3 days - regarding quantity - drinks up to 2 shots liquor at a time, or 1/3 to 1/2 bottle of wine at a time.    Depression screen [facility] 2/9 02/26/2020 08/11/2019 02/20/2019  Decreased Interest 0 0 0  Down, Depressed, Hopeless 0 0 0  PHQ - 2 Score 0 0 0  Altered sleeping - - 0  Tired, decreased energy - - 1  Change in appetite - - 3  Feeling bad or failure about yourself  - - 0  Trouble concentrating - - 0  Moving slowly or fidgety/restless - - 0  Suicidal thoughts - - 0  PHQ-9 Score - - 4  Difficult doing work/chores - - Not difficult at all    Social History   Tobacco Use  . Smoking status: Current Every Day Smoker    Packs/day: 0.75    Types: Cigarettes  . Smokeless tobacco: Current User  . Tobacco comment: quit in 2017, restarted.   Vaping Use  . Vaping Use: Never used  Substance Use Topics  . Alcohol use: Yes    Alcohol/week: 21.0 standard drinks    Types: 21 Glasses of wine per week    Comment: 3 glasses wine daily  . Drug use: Yes    Types:  Marijuana    Comment: occasional     Review of Systems Per HPI unless specifically indicated above     Objective:    BP 108/60   Pulse 65   Temp 98 F (36.7 C) (Temporal)   Resp 16   Ht 5\' 6"  (1.676 m)   Wt 133 lb 9.6 oz (60.6 kg)   SpO2 100%   BMI 21.56 kg/m   Wt Readings from Last 3 Encounters:  05/31/20 133 lb 9.6 oz (60.6 kg)  02/26/20 138 lb (62.6 kg)  08/11/19 149 lb 9.6 oz (67.9 kg)    Physical Exam Vitals and nursing note reviewed.  Constitutional:      General: She is not in acute distress.    Appearance: She is well-developed. She is not diaphoretic.     Comments: Well-appearing, comfortable, cooperative  HENT:     Head: Normocephalic and atraumatic.  Eyes:     General:        Right eye: No discharge.        Left eye: No discharge.     Conjunctiva/sclera: Conjunctivae normal.  Cardiovascular:     Rate and Rhythm: Normal rate.  Pulmonary:     Effort: Pulmonary effort is normal.  Musculoskeletal:  Right lower leg: No edema.     Left lower leg: No edema.  Skin:    General: Skin is warm and dry.     Findings: No erythema or rash.  Neurological:     Mental Status: She is alert and oriented to person, place, and time.  Psychiatric:        Behavior: Behavior normal.     Comments: Well groomed, good eye contact, normal speech and thoughts       Results for orders placed or performed in visit on 05/24/20  CBC with Differential/Platelet  Result Value Ref Range   WBC 8.1 3.4 - 10.8 x10E3/uL   RBC 3.68 (L) 3.77 - 5.28 x10E6/uL   Hemoglobin 12.9 11.1 - 15.9 g/dL   Hematocrit 37.4 34.0 - 46.6 %   MCV 102 (H) 79 - 97 fL   MCH 35.1 (H) 26.6 - 33.0 pg   MCHC 34.5 31 - 35 g/dL   RDW 11.3 (L) 11.7 - 15.4 %   Platelets 282 150 - 450 x10E3/uL   Neutrophils 66 Not Estab. %   Lymphs 25 Not Estab. %   Monocytes 7 Not Estab. %   Eos 1 Not Estab. %   Basos 1 Not Estab. %   Neutrophils Absolute 5.4 1 - 7 x10E3/uL   Lymphocytes Absolute 2.0 0 - 3 x10E3/uL     Monocytes Absolute 0.5 0 - 0 x10E3/uL   EOS (ABSOLUTE) 0.1 0.0 - 0.4 x10E3/uL   Basophils Absolute 0.0 0 - 0 x10E3/uL   Immature Granulocytes 0 Not Estab. %   Immature Grans (Abs) 0.0 0.0 - 0.1 x10E3/uL  Comprehensive metabolic panel  Result Value Ref Range   Glucose 103 (H) 65 - 99 mg/dL   BUN 21 8 - 27 mg/dL   Creatinine, Ser 0.70 0.57 - 1.00 mg/dL   GFR calc non Af Amer 90 >59 mL/min/1.73   GFR calc Af Amer 104 >59 mL/min/1.73   BUN/Creatinine Ratio 30 (H) 12 - 28   Sodium 136 134 - 144 mmol/L   Potassium 5.2 3.5 - 5.2 mmol/L   Chloride 100 96 - 106 mmol/L   CO2 24 20 - 29 mmol/L   Calcium 9.7 8.7 - 10.3 mg/dL   Total Protein 7.1 6.0 - 8.5 g/dL   Albumin 4.4 3.8 - 4.8 g/dL   Globulin, Total 2.7 1.5 - 4.5 g/dL   Albumin/Globulin Ratio 1.6 1.2 - 2.2   Bilirubin Total 0.4 0.0 - 1.2 mg/dL   Alkaline Phosphatase 69 48 - 121 IU/L   AST 25 0 - 40 IU/L   ALT 26 0 - 32 IU/L      Assessment & Plan:   Problem List Items Addressed This Visit    Loss of weight   Essential hypertension - Primary   Relevant Medications   losartan (COZAAR) 50 MG tablet    Other Visit Diagnoses    Hyponatremia       Alcohol use       Needs flu shot       Relevant Orders   Flu Vaccine QUAD High Dose(Fluad)      #Alcohol - IMPROVING Hyponatremia - RESOLVED  Lab normalized Sodium, other electrolytes LFT resolved Improving now with reduced alcohol intake, not daily Reduce alcohol intake as discussed safely taper down and quit or reduce amount  #Hypertension BP is low normal still, doing well off thiazide REDUCE Losartan from 100 down to 50mg  - new rx printed. She can cut tabs in half for  now  #Weight Loss Reduced weight loss, still some weight down in past 3 months Stable, improved appetite No further GI etiology per GI work up - She may contact and f/u back with GI in future if she wants to pursue CT imaging   Meds ordered this encounter  Medications  . losartan (COZAAR) 50 MG  tablet    Sig: Take 1 tablet (50 mg total) by mouth daily.    Dispense:  90 tablet    Refill:  3      Follow up plan: Return in about 9 months (around 02/28/2021) for 9 month fasting lab only then 1 week later [employer].  Future labs ordered for 9 months  Nobie Putnam, [facility] 05/31/2020, 11:19 AM

## 2020-05-31 NOTE — Patient Instructions (Addendum)
Thank you for coming to the office today.  REDUCE dose Losartan from 100mg  down to 50mg  - new rx printed.  Stay off hydrochlorothiazide  Sodium back to normal. Liver enzymes normal.  Flu Shot (high dose) today done.  Keep reducing alcohol intake and improving nutrition.  DUE for FASTING BLOOD WORK (no food or drink after midnight before the lab appointment, only water or coffee without cream/sugar on the morning of)  SCHEDULE "Lab Only" visit in the morning at the clinic for lab draw in 9 months  - Make sure Lab Only appointment is at about 1 week before your next appointment, so that results will be available  For Lab Results, once available within 2-3 days of blood draw, you can can log in to MyChart online to view your results and a brief explanation. Also, we can discuss results at next follow-up visit.   Please schedule a Follow-up Appointment to: Return in about 9 months (around 02/28/2021) for 9 month fasting lab only then 1 week later [employer].  If you have any other questions or concerns, please feel free to call the office or send a message through [facility]. You may also schedule an earlier appointment if necessary.  Additionally, you may be receiving a survey about your experience at our office within a few days to 1 week by e-mail or mail. We value your feedback.  Nobie Putnam, DO [place]

## 2020-06-02 ENCOUNTER — Other Ambulatory Visit: Payer: Self-pay

## 2020-06-02 ENCOUNTER — Ambulatory Visit (INDEPENDENT_AMBULATORY_CARE_PROVIDER_SITE_OTHER): Payer: Medicare Other

## 2020-06-02 DIAGNOSIS — Z23 Encounter for immunization: Secondary | ICD-10-CM | POA: Diagnosis not present

## 2020-06-28 ENCOUNTER — Other Ambulatory Visit: Payer: Self-pay | Admitting: Family Medicine

## 2020-06-28 DIAGNOSIS — F5101 Primary insomnia: Secondary | ICD-10-CM

## 2020-08-01 DIAGNOSIS — H02831 Dermatochalasis of right upper eyelid: Secondary | ICD-10-CM | POA: Diagnosis not present

## 2020-08-01 DIAGNOSIS — H2640 Unspecified secondary cataract: Secondary | ICD-10-CM | POA: Diagnosis not present

## 2020-08-01 DIAGNOSIS — H02885 Meibomian gland dysfunction left lower eyelid: Secondary | ICD-10-CM | POA: Diagnosis not present

## 2020-08-01 DIAGNOSIS — H02834 Dermatochalasis of left upper eyelid: Secondary | ICD-10-CM | POA: Diagnosis not present

## 2020-08-01 DIAGNOSIS — H35032 Hypertensive retinopathy, left eye: Secondary | ICD-10-CM | POA: Diagnosis not present

## 2020-08-01 DIAGNOSIS — Z961 Presence of intraocular lens: Secondary | ICD-10-CM | POA: Diagnosis not present

## 2020-10-03 ENCOUNTER — Other Ambulatory Visit: Payer: Self-pay | Admitting: Family Medicine

## 2020-10-03 DIAGNOSIS — I10 Essential (primary) hypertension: Secondary | ICD-10-CM

## 2020-10-04 ENCOUNTER — Other Ambulatory Visit: Payer: Self-pay | Admitting: Family Medicine

## 2020-10-04 DIAGNOSIS — I10 Essential (primary) hypertension: Secondary | ICD-10-CM

## 2020-10-04 MED ORDER — LOSARTAN POTASSIUM 50 MG PO TABS: 50.0000 mg | ORAL_TABLET | Freq: Every day | ORAL | 3 refills | Status: DC

## 2020-10-04 NOTE — Telephone Encounter (Signed)
Medication Refill - Medication: losartan (COZAAR) 50 MG tablet (patinet has a 1/2 of pill left, 100mg )     Has the patient contacted their pharmacy? Yes.  Pharmacy advised rx was denied and to contact PCP office. Patient states the MG was decreased from 100 MG to 50 MG.   (Agent: If yes, when and what did the pharmacy advise?)  Preferred Pharmacy (with phone number or street name):  [facility] [place], [place] - [address] Phone:  [phone]  Fax:  [phone]       Agent: Please be advised that RX refills may take up to 3 business days. We ask that you follow-up with your pharmacy.

## 2020-10-20 ENCOUNTER — Telehealth: Payer: Self-pay | Admitting: Family Medicine

## 2020-10-20 NOTE — Telephone Encounter (Signed)
Copied from [place] [id number]. Topic: Medicare AWV >> Oct 20, 2020 11:11 AM Cher Nakai R wrote: Left message for patient to call back and schedule the Medicare Annual Wellness Visit (AWV) virtually or by telephone.  Last AWV 08/11/2019  Please schedule at anytime with [place].  40 minute appointment  Any questions, please call me at [phone]

## 2020-11-01 ENCOUNTER — Other Ambulatory Visit: Payer: Self-pay | Admitting: Family Medicine

## 2020-11-01 MED ORDER — OMEPRAZOLE 20 MG PO CPDR
20.0000 mg | DELAYED_RELEASE_CAPSULE | Freq: Every day | ORAL | 1 refills | Status: DC
Start: 2020-11-01 — End: 2021-06-06

## 2020-11-01 NOTE — Telephone Encounter (Signed)
Requested medication (s) are due for refill today - yes  Requested medication (s) are on the active medication list -yes  Future visit scheduled -no  Last refill: 8 months ago  Notes to clinic: Request RF of medication prescribed by outside provider   Requested Prescriptions  Pending Prescriptions Disp Refills   omeprazole (PRILOSEC) 20 MG capsule 90 capsule 1    Sig: Take 1 capsule (20 mg total) by mouth daily.      Gastroenterology: Proton Pump Inhibitors Passed - 11/01/2020  1:39 PM      Passed - Valid encounter within last 12 months    Recent Outpatient Visits           5 months ago Essential hypertension   [facility] Olin Hauser, DO   8 months ago Essential hypertension   [place], Devonne Doughty, DO   1 year ago Unexplained weight loss   [place], DO   1 year ago Poor appetite   [place], DO   3 years ago Annual physical exam   [place], DO                    Requested Prescriptions  Pending Prescriptions Disp Refills   omeprazole (PRILOSEC) 20 MG capsule 90 capsule 1    Sig: Take 1 capsule (20 mg total) by mouth daily.      Gastroenterology: Proton Pump Inhibitors Passed - 11/01/2020  1:39 PM      Passed - Valid encounter within last 12 months    Recent Outpatient Visits           5 months ago Essential hypertension   [place], DO   8 months ago Essential hypertension   [place], DO   1 year ago Unexplained weight loss   [place], DO   1 year ago Poor appetite   [place], DO   3 years ago Annual physical exam   [place], Devonne Doughty, DO

## 2020-11-01 NOTE — Telephone Encounter (Signed)
Medication Refill - Medication: omeprazole (PRILOSEC) 20 MG capsule Pt called to report that she has been waiting for her refill since last Friday, please advise. Pt is completely out of her current supply   Has the patient contacted their pharmacy? Yes.   (Agent: If no, request that the patient contact the pharmacy for the refill.) (Agent: If yes, when and what did the pharmacy advise?)  Preferred Pharmacy (with phone number or street name):  [facility]ORE [place], [place] - [address]  [place] [address]  Phone: [phone] Fax: [phone]     Agent: Please be advised that RX refills may take up to 3 business days. We ask that you follow-up with your pharmacy.

## 2020-12-05 ENCOUNTER — Other Ambulatory Visit: Payer: Self-pay | Admitting: Family Medicine

## 2020-12-05 DIAGNOSIS — M810 Age-related osteoporosis without current pathological fracture: Secondary | ICD-10-CM

## 2021-01-25 ENCOUNTER — Ambulatory Visit (INDEPENDENT_AMBULATORY_CARE_PROVIDER_SITE_OTHER): Payer: Medicare Other | Admitting: Obstetrics and Gynecology

## 2021-01-25 ENCOUNTER — Other Ambulatory Visit (HOSPITAL_COMMUNITY)
Admission: RE | Admit: 2021-01-25 | Discharge: 2021-01-25 | Disposition: A | Discharge status: Home / Self Care | Payer: Medicare Other | Source: Ambulatory Visit | Attending: Obstetrics and Gynecology | Admitting: Obstetrics and Gynecology

## 2021-01-25 ENCOUNTER — Encounter: Payer: Self-pay | Admitting: Obstetrics and Gynecology

## 2021-01-25 ENCOUNTER — Other Ambulatory Visit: Payer: Self-pay

## 2021-01-25 VITALS — BP 120/68 | Ht 66.0 in | Wt 136.2 lb

## 2021-01-25 DIAGNOSIS — L720 Epidermal cyst: Secondary | ICD-10-CM | POA: Diagnosis not present

## 2021-01-25 DIAGNOSIS — N9089 Other specified noninflammatory disorders of vulva and perineum: Secondary | ICD-10-CM | POA: Insufficient documentation

## 2021-01-25 DIAGNOSIS — N766 Ulceration of vulva: Secondary | ICD-10-CM | POA: Diagnosis not present

## 2021-01-25 MED ORDER — TRAMADOL HCL 50 MG PO TABS: 50.0000 mg | ORAL_TABLET | Freq: Four times a day (QID) | ORAL | 0 refills | Status: DC | PRN

## 2021-01-25 NOTE — Patient Instructions (Signed)
How to Take a Sitz Bath A sitz bath is a warm water bath that may be used to care for your rectum, genital area, or the area between your rectum and genitals (perineum). In a sitz bath, the water only comes up to your hips and covers your buttocks. A sitz bath may be done in a bathtub or with a portable sitz bath that fits over the toilet. Your health care provider may recommend a sitz bath to help:  Relieve pain and discomfort after delivering a baby.  Relieve pain and itching from hemorrhoids or anal fissures.  Relieve pain after certain surgeries.  Relax muscles that are sore or tight. How to take a sitz bath Take 3-4 sitz baths a day, or as many as told by your health care provider. Bathtub sitz bath To take a sitz bath in a bathtub: 1. Partially fill a bathtub with warm water. The water should be deep enough to cover your hips and buttocks when you are sitting in the tub. 2. Follow your health care provider's instructions if you are told to put medicine in the water. 3. Sit in the water. Open the tub drain a little, and leave it open during your bath. 4. Turn on the warm water again, enough to replace the water that is draining out. Keep the water running throughout your bath. This helps keep the water at the right level and temperature. 5. Soak in the water for 15-20 minutes, or as long as told by your health care provider. 6. When you are done, be careful when you stand up. You may feel dizzy. 7. After the sitz bath, pat yourself dry. Do not rub your skin to dry it.   Over-the-toilet sitz bath To take a sitz bath with an over-the-toilet basin: 1. Follow the manufacturer's instructions. 2. Fill the basin with warm water. 3. Follow your health care provider's instructions if you were told to put medicine in the water. 4. Sit on the seat. Make sure the water covers your buttocks and perineum. 5. Soak in the water for 15-20 minutes, or as long as told by your health care  provider. 6. After the sitz bath, pat yourself dry. Do not rub your skin to dry it. 7. Clean and dry the basin between uses. 8. Discard the basin if it cracks, or according to the manufacturer's instructions.   Contact a health care provider if:  Your pain or itching gets worse. Do not continue with sitz baths if your symptoms get worse.  You have new symptoms. Do not continue with sitz baths until you talk with your health care provider. Summary  A sitz bath is a warm water bath in which the water only comes up to your hips and covers your buttocks.  A sitz bath may help relieve pain and discomfort after delivering a baby. It also may help with pain and itching from hemorrhoids or anal fissures, or pain after certain surgeries. It can also help to relax muscles that are sore or tight.  Take 3-4 sitz baths a day, or as many as told by your health care provider. Soak in the water for 15-20 minutes.  Do not continue with sitz baths if your symptoms get worse. This information is not intended to replace advice given to you by your health care provider. Make sure you discuss any questions you have with your health care provider. Document Revised: 05/26/2020 Document Reviewed: 05/26/2020 Elsevier Patient Education  2021 Elsevier Inc.  

## 2021-01-25 NOTE — Progress Notes (Signed)
Patient ID: Monica Chapman, female   DOB: Oct 08, 1950, 70 y.o.   MRN: [id number]  Reason for Consult: Gynecologic Exam   Referred by Nobie Putnam *  Subjective:     HPI:  Monica Chapman is a 70 y.o. female.  She reports that is making it has been many years since she has seen a gynecologist.  However for last several months she has had several lesions on her bottom which have become painful and uncomfortable.  She reports that the lesion on her right labia is catching on her clothing and she would like to have it examined.  Gynecological History  No LMP recorded. Patient has had a hysterectomy. Menarche: 14 Menopause: uncertaine  History of fibroids, polyps, or ovarian cysts? : yes  History of PCOS? no Hstory of Endometriosis? yes History of abnormal pap smears? no Have you had any sexually transmitted infections in the past? no  Last Pap:  More than 38 years ago  She identifies as a female. She is not sexually active.   She denies dyspareunia.   Obstetrical History OB History  Gravida Para Term Preterm AB Living  2 2 2     2   SAB IAB Ectopic Multiple Live Births          2    # Outcome Date GA Lbr Len/2nd Weight Sex Delivery Anes PTL Lv  2 Term 01/18/75    [address]   LIV  1 Term 03/1878    [address]   LIV     Past Medical History:  Diagnosis Date  . Arthritis   . Barrett esophagus   . Hypertension    Family History  Problem Relation Age of Onset  . Breast cancer Mother 81  . Breast cancer Maternal Grandmother    Past Surgical History:  Procedure Laterality Date  . ABDOMINAL HYSTERECTOMY    . COLONOSCOPY WITH PROPOFOL N/A 03/12/2019   Procedure: COLONOSCOPY WITH PROPOFOL;  Surgeon: Virgel Manifold, MD;  Location: [facility] END[facility]COPY;  Service: Endoscopy;  Laterality: N/A;  . ESOPHAGOGASTRODUODENOSCOPY (EGD) WITH PROPOFOL N/A 03/12/2019   Procedure: ESOPHAGOGASTRODUODENOSCOPY (EGD) WITH PROPOFOL;  Surgeon: Virgel Manifold, MD;   Location: [facility] ENDOSCOPY;  Service: Endoscopy;  Laterality: N/A;  . FOOT SURGERY    . HAND SURGERY      Short Social History:  Social History   Tobacco Use  . Smoking status: Current Every Day Smoker    Packs/day: 0.50    Types: Cigarettes  . Smokeless tobacco: Current User  . Tobacco comment: quit in 2017, restarted.   Substance Use Topics  . Alcohol use: Yes    Alcohol/week: 21.0 standard drinks    Types: 21 Glasses of wine per week    Comment: 3 glasses wine daily    Allergies  Allergen Reactions  . Penicillins Itching    At about 70 yo with itching not serious. Did not require medical care    Current Outpatient Medications  Medication Sig Dispense Refill  . alendronate (FOSAMAX) 70 MG tablet TAKE 1 TABLET(70 MG) BY MOUTH 1 TIME A WEEK WITH A FULL GLASS OF WATER AND ON AN EMPTY STOMACH 12 tablet 1  . atorvastatin (LIPITOR) 10 MG tablet Take 1 tablet (10 mg total) by mouth daily at 6 PM. 90 tablet 1  . calcium-vitamin D (OSCAL WITH D) 500-200 MG-UNIT per tablet Take 1 tablet by mouth.    . Cetirizine HCl 10 MG CAPS Take 10 mg by mouth at bedtime.     Marland Kitchen  escitalopram (LEXAPRO) 10 MG tablet TAKE 1 TABLET(10 MG) BY MOUTH DAILY 90 tablet 0  . losartan (COZAAR) 50 MG tablet Take 1 tablet (50 mg total) by mouth daily. 90 tablet 3  . Melatonin 3 MG TABS Take by mouth as needed.    Marland Kitchen omeprazole (PRILOSEC) 20 MG capsule Take 1 capsule (20 mg total) by mouth daily. 90 capsule 1  . traMADol (ULTRAM) 50 MG tablet Take 1 tablet (50 mg total) by mouth every 6 (six) hours as needed. 10 tablet 0   No current facility-administered medications for this visit.    Review of Systems  Constitutional: Negative for chills, fatigue, fever and unexpected weight change.  HENT: Negative for trouble swallowing.  Eyes: Negative for loss of vision.  Respiratory: Negative for cough, shortness of breath and wheezing.  Cardiovascular: Negative for chest pain, leg swelling, palpitations and syncope.   GI: Negative for abdominal pain, blood in stool, diarrhea, nausea and vomiting.  GU: Negative for difficulty urinating, dysuria, frequency and hematuria.  Musculoskeletal: Negative for back pain, leg pain and joint pain.  Skin: Negative for rash.  Neurological: Negative for dizziness, headaches, light-headedness, numbness and seizures.  Psychiatric: Negative for behavioral problem, confusion, depressed mood and sleep disturbance.        Objective:  Objective   Vitals:   01/25/21 1600  BP: 120/68  Weight: 136 lb 3.2 oz (61.8 kg)  Height: 5\' 6"  (1.676 m)   Body mass index is 21.98 kg/m.  Physical Exam Vitals and nursing note reviewed. Exam conducted with a chaperone present.  Constitutional:      Appearance: Normal appearance. She is well-developed.  HENT:     Head: Normocephalic and atraumatic.  Eyes:     Extraocular Movements: Extraocular movements intact.     Pupils: Pupils are equal, round, and reactive to light.  Cardiovascular:     Rate and Rhythm: Normal rate and regular rhythm.  Pulmonary:     Effort: Pulmonary effort is normal. No respiratory distress.     Breath sounds: Normal breath sounds.  Abdominal:     General: Abdomen is flat.     Palpations: Abdomen is soft.  Genitourinary:   Musculoskeletal:        General: No signs of injury.  Skin:    General: Skin is warm and dry.  Neurological:     Mental Status: She is alert and oriented to person, place, and time.  Psychiatric:        Behavior: Behavior normal.        Thought Content: Thought content normal.        Judgment: Judgment normal.    VULVAR BIOPSY NOTE The indications for vulvar biopsy (rule out neoplasia, establish lichen sclerosus diagnosis) were reviewed.   Risks of the biopsy including pain, bleeding, infection, inadequate specimen, scarring and need for additional procedures  were discussed. The patient stated understanding and agreed to undergo procedure today. Consent was signed,  time  out performed.   The patient's vulva was prepped with Betadine. 1% lidocaine was injected into the two area of concern.  I initially thought that the area on her right vulva was a sebaceous cyst which I incised with a scalpel.  However with no fluid or sebaceous material was expressed from the cyst I decided to biopsy the area.  A 3 -mm punch biopsy was done, biopsy tissue was picked up with sterile forceps and sterile scissors were used to excise the lesion.   I biopsied the lesion on  her left buttocks in the same manner.  Small bleeding was noted and hemostasis was achieved using silver nitrate sticks.  The patient tolerated the procedure well. Post-procedure instructions  (pelvic rest for one week) were given to the patient. The patient is to call with heavy bleeding, fever greater than 100.4, foul smelling vaginal discharge or other concerns.    Assessment/Plan:     70 year old with 2 vulvar lesions Vulvar biopsies performed today.  Patient will follow up next week for close follow-up.  Instructed to keep areas clean with twice a day sitz bath's.  Provided her with a prescription for tramadol for pain.   More than 30 minutes were spent face to face with the patient in the room, reviewing the medical record, labs and images, and coordinating care for the patient. The plan of management was discussed in detail and counseling was provided.     Adrian Prows MD [facility], [facility] 01/27/2021 1:14 PM

## 2021-01-30 LAB — SURGICAL PATHOLOGY

## 2021-02-03 ENCOUNTER — Encounter: Payer: Self-pay | Admitting: Obstetrics and Gynecology

## 2021-02-03 ENCOUNTER — Other Ambulatory Visit: Payer: Self-pay

## 2021-02-03 ENCOUNTER — Ambulatory Visit (INDEPENDENT_AMBULATORY_CARE_PROVIDER_SITE_OTHER): Payer: Medicare Other | Admitting: Obstetrics and Gynecology

## 2021-02-03 VITALS — BP 120/70 | Ht 66.0 in | Wt 135.4 lb

## 2021-02-03 DIAGNOSIS — N9089 Other specified noninflammatory disorders of vulva and perineum: Secondary | ICD-10-CM | POA: Diagnosis not present

## 2021-02-03 NOTE — Progress Notes (Signed)
Patient ID: Monica Chapman, female   DOB: 1951/08/22, 70 y.o.   MRN: [id number]  Reason for Consult: No chief complaint on file.   Referred by Nobie Putnam *  Subjective:     HPI:  Monica Chapman is a 70 y.o. female she is following up from 2 vulvar and perineal biopsies last week.  We discussed the pathology report which was favorable.  I spoke to the pathologist Dr. Saralyn Pilar over the phone who relayed that these findings were not concerning for precancerous or cancerous changes.  He recommended observation and repeat biopsy if for some reason the lesions change or growth. She has been performing sitz bath's at home and taking care of the lesions.  She has not noticed any signs of infections and denies any issues with fevers or drainage from the lesions.  Gynecological History  No LMP recorded. Patient has had a hysterectomy.   Past Medical History:  Diagnosis Date  . Arthritis   . Barrett esophagus   . Hypertension    Family History  Problem Relation Age of Onset  . Breast cancer Mother 51  . Breast cancer Maternal Grandmother    Past Surgical History:  Procedure Laterality Date  . ABDOMINAL HYSTERECTOMY    . COLONOSCOPY WITH PROPOFOL N/A 03/12/2019   Procedure: COLONOSCOPY WITH PROPOFOL;  Surgeon: Virgel Manifold, MD;  Location: [facility];  Service: Endoscopy;  Laterality: N/A;  . ESOPHAGOGASTRODUODENOSCOPY (EGD) WITH PROPOFOL N/A 03/12/2019   Procedure: ESOPHAGOGASTRODUODENOSCOPY (EGD) WITH PROPOFOL;  Surgeon: Virgel Manifold, MD;  Location: [facility];  Service: Endoscopy;  Laterality: N/A;  . FOOT SURGERY    . HAND SURGERY      Short Social History:  Social History   Tobacco Use  . Smoking status: Current Every Day Smoker    Packs/day: 0.50    Types: Cigarettes  . Smokeless tobacco: Current User  . Tobacco comment: quit in 2017, restarted.   Substance Use Topics  . Alcohol use: Yes    Alcohol/week: 21.0 standard drinks     Types: 21 Glasses of wine per week    Comment: 3 glasses wine daily    Allergies  Allergen Reactions  . Penicillins Itching    At about 70 yo with itching not serious. Did not require medical care    Current Outpatient Medications  Medication Sig Dispense Refill  . alendronate (FOSAMAX) 70 MG tablet TAKE 1 TABLET(70 MG) BY MOUTH 1 TIME A WEEK WITH A FULL GLASS OF WATER AND ON AN EMPTY STOMACH 12 tablet 1  . atorvastatin (LIPITOR) 10 MG tablet Take 1 tablet (10 mg total) by mouth daily at 6 PM. 90 tablet 1  . calcium-vitamin D (OSCAL WITH D) 500-200 MG-UNIT per tablet Take 1 tablet by mouth.    . Cetirizine HCl 10 MG CAPS Take 10 mg by mouth at bedtime.     Marland Kitchen escitalopram (LEXAPRO) 10 MG tablet TAKE 1 TABLET(10 MG) BY MOUTH DAILY 90 tablet 0  . losartan (COZAAR) 50 MG tablet Take 1 tablet (50 mg total) by mouth daily. 90 tablet 3  . Melatonin 3 MG TABS Take by mouth as needed.    Marland Kitchen omeprazole (PRILOSEC) 20 MG capsule Take 1 capsule (20 mg total) by mouth daily. 90 capsule 1  . traMADol (ULTRAM) 50 MG tablet Take 1 tablet (50 mg total) by mouth every 6 (six) hours as needed. 10 tablet 0   No current facility-administered medications for this visit.    Review of  Systems  Constitutional: Negative for chills, fatigue, fever and unexpected weight change.  HENT: Negative for trouble swallowing.  Eyes: Negative for loss of vision.  Respiratory: Negative for cough, shortness of breath and wheezing.  Cardiovascular: Negative for chest pain, leg swelling, palpitations and syncope.  GI: Negative for abdominal pain, blood in stool, diarrhea, nausea and vomiting.  GU: Negative for difficulty urinating, dysuria, frequency and hematuria.  Musculoskeletal: Negative for back pain, leg pain and joint pain.  Skin: Negative for rash.  Neurological: Negative for dizziness, headaches, light-headedness, numbness and seizures.  Psychiatric: Negative for behavioral problem, confusion, depressed mood and  sleep disturbance.        Objective:  Objective   Vitals:   02/03/21 1100  BP: 120/70  Weight: 135 lb 6.4 oz (61.4 kg)  Height: 5\' 6"  (1.676 m)   Body mass index is 21.85 kg/m.  Physical Exam Vitals and nursing note reviewed. Exam conducted with a chaperone present.  Constitutional:      Appearance: Normal appearance. She is well-developed.  HENT:     Head: Normocephalic and atraumatic.  Eyes:     Extraocular Movements: Extraocular movements intact.     Pupils: Pupils are equal, round, and reactive to light.  Cardiovascular:     Rate and Rhythm: Normal rate and regular rhythm.  Pulmonary:     Effort: Pulmonary effort is normal. No respiratory distress.     Breath sounds: Normal breath sounds.  Abdominal:     General: Abdomen is flat.     Palpations: Abdomen is soft.  Genitourinary:   Musculoskeletal:        General: No signs of injury.  Skin:    General: Skin is warm and dry.  Neurological:     Mental Status: She is alert and oriented to person, place, and time.  Psychiatric:        Behavior: Behavior normal.        Thought Content: Thought content normal.        Judgment: Judgment normal.     Assessment/Plan:     70 year old status post vulvar and perineal biopsies.  Biopsy sites are healing well.  Labial biopsy is completely resolved left buttock biopsy has a small opening but is without any evidence of infection and appears well on its way to complete resolution.  We will have the patient return in 6 months for monitoring of these biopsy sites for any changes in case wider excision needs to be considered however the pathology report was favorable and observation is just needed.  Plan recommended long-term yearly visual monitoring of the vulva and perineum as part of patient's annual pelvic exam.  More than 10 minutes were spent face to face with the patient in the room, reviewing the medical record, labs and images, and coordinating care for the patient. The  plan of management was discussed in detail and counseling was provided.    Adrian Prows MD [facility], [facility] 02/03/2021 11:18 AM

## 2021-05-19 ENCOUNTER — Telehealth: Payer: Self-pay | Admitting: Family Medicine

## 2021-05-19 DIAGNOSIS — E782 Mixed hyperlipidemia: Secondary | ICD-10-CM

## 2021-05-19 NOTE — Telephone Encounter (Signed)
Requested medications are due for refill today yes  Requested medications are on the active medication list yes  Last refill 05/07/20  Last visit 05/31/20, lab ordered but not drawn, last lab was 02/2020  Future visit scheduled no  Notes to clinic failed protocol of lab within 360 days, also NOTE this med has not been filled since 05/07/20.

## 2021-05-23 ENCOUNTER — Other Ambulatory Visit: Payer: Self-pay | Admitting: Internal Medicine

## 2021-05-23 DIAGNOSIS — E782 Mixed hyperlipidemia: Secondary | ICD-10-CM

## 2021-05-23 MED ORDER — ATORVASTATIN CALCIUM 10 MG PO TABS: 10.0000 mg | ORAL_TABLET | Freq: Every day | ORAL | 0 refills | Status: DC

## 2021-05-23 NOTE — Telephone Encounter (Signed)
Pt called to schedule appt for refill / Dr. Marthann Schiller next available appt is 9.13.22/ pt wants to know if she can get a courtesy refill until her appt date/ she took the last of this medication today / pt also would like lab orders for her blood work placed so she can go to [facility] a few days before her appt/please advise

## 2021-05-23 NOTE — Addendum Note (Signed)
Addended by: Jearld Fenton on: 05/23/2021 03:21 PM   Modules accepted: Orders

## 2021-05-23 NOTE — Telephone Encounter (Signed)
Atorvastatin refilled.  

## 2021-05-23 NOTE — Telephone Encounter (Signed)
Which medication is she requesting?

## 2021-05-24 NOTE — Telephone Encounter (Signed)
Requested medication (s) are due for refill today: no just signed for #30 refill 05/23/21  Requested medication (s) are on the active medication list: yes   Last refill:  05/23/21 #30 0 refills  Future visit scheduled: yes in 1 week  Notes to clinic:  patient requesting 90 day refill. Last labs 02/23/20 do you want to give 90 refill?     Requested Prescriptions  Pending Prescriptions Disp Refills   atorvastatin (LIPITOR) 10 MG tablet [Pharmacy Med Name: ATORVASTATIN '10MG'$  TABLETS] 90 tablet     Sig: TAKE 1 TABLET(10 MG) BY MOUTH DAILY AT 6 PM     Cardiovascular:  Antilipid - Statins Failed - 05/23/2021  3:46 PM      Failed - Total Cholesterol in normal range and within 360 days    Cholesterol, Total  Date Value Ref Range Status  02/23/2020 202 (H) 100 - 199 mg/dL Final          Failed - LDL in normal range and within 360 days    LDL Cholesterol (Calc)  Date Value Ref Range Status  09/04/2017 101 (H) mg/dL (calc) Final    Comment:    Reference range: <100 . Desirable range <100 mg/dL for primary prevention;   <70 mg/dL for patients with CHD or diabetic patients  with > or = 2 CHD risk factors. Marland Kitchen LDL-C is now calculated using the Martin-Hopkins  calculation, which is a validated novel method providing  better accuracy than the Friedewald equation in the  estimation of LDL-C.  Cresenciano Genre et al. Annamaria Helling. [id number]): 2061-2068  (http://education.[employer].com/faq/FAQ164)    LDL Chol Calc (NIH)  Date Value Ref Range Status  02/23/2020 82 0 - 99 mg/dL Final          Failed - HDL in normal range and within 360 days    HDL  Date Value Ref Range Status  02/23/2020 105 >39 mg/dL Final          Failed - Triglycerides in normal range and within 360 days    Triglycerides  Date Value Ref Range Status  02/23/2020 85 0 - 149 mg/dL Final          Passed - Patient is not pregnant      Passed - Valid encounter within last 12 months    Recent Outpatient Visits            11 months ago Essential hypertension   [place], Devonne Doughty, DO   1 year ago Essential hypertension   [place], Devonne Doughty, DO   2 years ago Unexplained weight loss   [place], DO   2 years ago Poor appetite   [place], DO   3 years ago Annual physical exam   [place], DO       Future Appointments             In 1 week [place], Devonne Doughty, [facility], [facility]

## 2021-05-30 ENCOUNTER — Ambulatory Visit (INDEPENDENT_AMBULATORY_CARE_PROVIDER_SITE_OTHER): Payer: Medicare Other

## 2021-05-30 VITALS — Ht 66.0 in | Wt 130.0 lb

## 2021-05-30 DIAGNOSIS — Z13820 Encounter for screening for osteoporosis: Secondary | ICD-10-CM | POA: Diagnosis not present

## 2021-05-30 DIAGNOSIS — Z0000 Encounter for general adult medical examination without abnormal findings: Secondary | ICD-10-CM | POA: Diagnosis not present

## 2021-05-30 DIAGNOSIS — E2839 Other primary ovarian failure: Secondary | ICD-10-CM | POA: Diagnosis not present

## 2021-05-30 NOTE — Progress Notes (Signed)
I connected with Monica Chapman today by telephone and verified that I am speaking with the correct person using two identifiers. Location patient: home Location provider: work Persons participating in the virtual visit: Esha Sirkin, Glenna Durand LPN.   I discussed the limitations, risks, security and privacy concerns of performing an evaluation and management service by telephone and the availability of in person appointments. I also discussed with the patient that there may be a patient responsible charge related to this service. The patient expressed understanding and verbally consented to this telephonic visit.    Interactive audio and video telecommunications were attempted between this provider and patient, however failed, due to patient having technical difficulties OR patient did not have access to video capability.  We continued and completed visit with audio only.     Vital signs may be patient reported or missing.  Subjective:   Monica Chapman is a 70 y.o. female who presents for an Initial Medicare Annual Wellness Visit.  Review of Systems     Cardiac Risk Factors include: advanced age (>71mn, >>5women);dyslipidemia;hypertension;smoking/ tobacco exposure     Objective:    Today's Vitals   05/30/21 1436  Weight: 130 lb (59 kg)  Height: '5\' 6"'$  (1.676 m)   Body mass index is 20.98 kg/m.  Advanced Directives 05/30/2021 08/11/2019 03/12/2019  Does Patient Have a Medical Advance Directive? No Yes No  Type of Advance Directive - Living will;Healthcare Power of Attorney -  CParmain Chart? - No - copy requested -  Would patient like information on creating a medical advance directive? - - No - Patient declined    Current Medications (verified) Outpatient Encounter Medications as of 05/30/2021  Medication Sig   alendronate (FOSAMAX) 70 MG tablet TAKE 1 TABLET(70 MG) BY MOUTH 1 TIME A WEEK WITH A FULL GLASS OF WATER AND ON AN EMPTY STOMACH    atorvastatin (LIPITOR) 10 MG tablet TAKE 1 TABLET(10 MG) BY MOUTH DAILY AT 6 PM   calcium-vitamin D (OSCAL WITH D) 500-200 MG-UNIT per tablet Take 1 tablet by mouth.   Cetirizine HCl 10 MG CAPS Take 10 mg by mouth at bedtime.    escitalopram (LEXAPRO) 10 MG tablet TAKE 1 TABLET(10 MG) BY MOUTH DAILY   losartan (COZAAR) 50 MG tablet Take 1 tablet (50 mg total) by mouth daily.   Melatonin 3 MG TABS Take by mouth as needed.   omeprazole (PRILOSEC) 20 MG capsule Take 1 capsule (20 mg total) by mouth daily.   traMADol (ULTRAM) 50 MG tablet Take 1 tablet (50 mg total) by mouth every 6 (six) hours as needed. (Patient not taking: Reported on 05/30/2021)   No facility-administered encounter medications on file as of 05/30/2021.    Allergies (verified) Penicillins   History: Past Medical History:  Diagnosis Date   Arthritis    Barrett esophagus    Hypertension    Past Surgical History:  Procedure Laterality Date   ABDOMINAL HYSTERECTOMY     COLONOSCOPY WITH PROPOFOL N/A 03/12/2019   Procedure: COLONOSCOPY WITH PROPOFOL;  Surgeon: TVirgel Manifold MD;  Location: [facility];  Service: Endoscopy;  Laterality: N/A;   ESOPHAGOGASTRODUODENOSCOPY (EGD) WITH PROPOFOL N/A 03/12/2019   Procedure: ESOPHAGOGASTRODUODENOSCOPY (EGD) WITH PROPOFOL;  Surgeon: TVirgel Manifold MD;  Location: [facility] ENDOSCOPY;  Service: Endoscopy;  Laterality: N/A;   FOOT SURGERY     HAND SURGERY     Family History  Problem Relation Age of Onset   Breast cancer Mother 359  Breast cancer Maternal Grandmother    Social History   Socioeconomic History   Marital status: Married    Spouse name: Not on file   Number of children: Not on file   Years of education: Not on file   Highest education level: High school graduate  Occupational History   Not on file  Tobacco Use   Smoking status: Every Day    Packs/day: 0.50    Types: Cigarettes   Smokeless tobacco: Current   Tobacco comments:    quit in 2017,  restarted.   Vaping Use   Vaping Use: Never used  Substance and Sexual Activity   Alcohol use: Yes    Alcohol/week: 21.0 standard drinks    Types: 21 Glasses of wine per week    Comment: 3 glasses wine daily   Drug use: Yes    Types: Marijuana    Comment: occasional    Sexual activity: Not Currently  Other Topics Concern   Not on file  Social History Narrative   Not on file   Social Determinants of Health   Financial Resource Strain: Low Risk    Difficulty of Paying Living Expenses: Not hard at all  Food Insecurity: No Food Insecurity   Worried About Charity fundraiser in the Last Year: Never true   Ran Out of Food in the Last Year: Never true  Transportation Needs: No Transportation Needs   Lack of Transportation (Medical): No   Lack of Transportation (Non-Medical): No  Physical Activity: Inactive   Days of Exercise per Week: 0 days   Minutes of Exercise per Session: 0 min  Stress: No Stress Concern Present   Feeling of Stress : Not at all  Social Connections: Not on file    Tobacco Counseling Ready to quit: Yes Counseling given: Not Answered Tobacco comments: quit in 2017, restarted.    Clinical Intake:  Pre-visit preparation completed: Yes  Pain : No/denies pain     Nutritional Status: BMI of 19-24  Normal Nutritional Risks: None Diabetes: No  How often do you need to have someone help you when you read instructions, pamphlets, or other written materials from your doctor or pharmacy?: 1 - Never What is the last grade level you completed in school?: 12th grade  Diabetic? no  Interpreter Needed?: No  Information entered by :: NAllen LPN   Activities of Daily Living In your present state of health, do you have any difficulty performing the following activities: 05/30/2021  Hearing? N  Vision? N  Difficulty concentrating or making decisions? N  Walking or climbing stairs? N  Dressing or bathing? N  Doing errands, shopping? N  Preparing Food and  eating ? N  Using the Toilet? N  In the past six months, have you accidently leaked urine? N  Do you have problems with loss of bowel control? N  Managing your Medications? N  Managing your Finances? N  Housekeeping or managing your Housekeeping? N  Some recent data might be hidden    Patient Care Team: Olin Hauser, DO as PCP - General (Family Medicine) Virgel Manifold, MD as Consulting Physician (Gastroenterology)  Indicate any recent Medical Services you may have received from other than [facility] providers in the past year (date may be approximate).     Assessment:   This is a routine wellness examination for Aariel.  Hearing/Vision screen Vision Screening - Comments:: Regular eye exams, [facility]  Dietary issues and exercise activities discussed: Current Exercise Habits: The  patient does not participate in regular exercise at present   Goals Addressed             This Visit's Progress    Patient Stated       05/30/2021, no goals       Depression Screen PHQ 2/9 Scores 05/30/2021 02/26/2020 08/11/2019 02/20/2019 11/07/2018 09/10/2017 05/07/2017  PHQ - 2 Score 0 0 0 0 - 2 2  PHQ- 9 Score - - - 4 - 2 3  Exception Documentation - - - - Patient refusal - -    Fall Risk Fall Risk  05/30/2021 02/26/2020 08/11/2019 09/10/2017 12/03/2016  Falls in the past year? 0 0 0 No No  Number falls in past yr: - 0 0 - -  Injury with Fall? - 0 0 - -  Risk for fall due to : Medication side effect - - - -  Follow up Falls evaluation completed;Education provided;Falls prevention discussed Falls evaluation completed - - -    FALL RISK PREVENTION PERTAINING TO THE HOME:  Any stairs in or around the home? Yes  If so, are there any without handrails? No  Home free of loose throw rugs in walkways, pet beds, electrical cords, etc? Yes  Adequate lighting in your home to reduce risk of falls? Yes   ASSISTIVE DEVICES UTILIZED TO PREVENT FALLS:  Life alert? No  Use of a cane,  walker or w/c? No  Grab bars in the bathroom? No  Shower chair or bench in shower? No  Elevated toilet seat or a handicapped toilet? Yes   TIMED UP AND GO:  Was the test performed? No .       Cognitive Function:     6CIT Screen 05/30/2021 08/11/2019  What Year? 0 points 0 points  What month? 0 points 0 points  What time? 0 points 0 points  Count back from 20 0 points 0 points  Months in reverse 0 points 0 points  Repeat phrase 0 points 0 points  Total Score 0 0    Immunizations Immunization History  Administered Date(s) Administered   Fluad Quad(high Dose 65+) 07/07/2019, 06/02/2020   Influenza, High Dose Seasonal PF 11/07/2018   Influenza-Unspecified 06/22/2016   Moderna Sars-Covid-2 Vaccination 12/12/2019, 01/09/2020   Pneumococcal Conjugate-13 08/11/2019    TDAP status: Up to date  Flu Vaccine status: Due, Education has been provided regarding the importance of this vaccine. Advised may receive this vaccine at local pharmacy or Health Dept. Aware to provide a copy of the vaccination record if obtained from local pharmacy or Health Dept. Verbalized acceptance and understanding.  Pneumococcal vaccine status: Due, Education has been provided regarding the importance of this vaccine. Advised may receive this vaccine at local pharmacy or Health Dept. Aware to provide a copy of the vaccination record if obtained from local pharmacy or Health Dept. Verbalized acceptance and understanding.  Covid-19 vaccine status: Completed vaccines  Qualifies for Shingles Vaccine? Yes   Zostavax completed Yes   Shingrix Completed?: No.    Education has been provided regarding the importance of this vaccine. Patient has been advised to call insurance company to determine out of pocket expense if they have not yet received this vaccine. Advised may also receive vaccine at local pharmacy or Health Dept. Verbalized acceptance and understanding.  Screening Tests Health Maintenance  Topic Date  Due   Zoster Vaccines- Shingrix (1 of 2) Never done   COVID-19 Vaccine (3 - Booster for Moderna series) 06/10/2020   PNA vac Low Risk  Adult (2 of 2 - PPSV23) 08/10/2020   INFLUENZA VACCINE  04/24/2021   COLONOSCOPY (Pts 45-46yr Insurance coverage will need to be confirmed)  03/11/2022   MAMMOGRAM  05/11/2022   TETANUS/TDAP  02/02/2024   DEXA SCAN  Completed   Hepatitis C Screening  Completed   HPV VACCINES  Aged Out    Health Maintenance  Health Maintenance Due  Topic Date Due   Zoster Vaccines- Shingrix (1 of 2) Never done   COVID-19 Vaccine (3 - Booster for Moderna series) 06/10/2020   PNA vac Low Risk Adult (2 of 2 - PPSV23) 08/10/2020   INFLUENZA VACCINE  04/24/2021    Colorectal cancer screening: Type of screening: Colonoscopy. Completed 03/12/2019. Repeat every 3 years  Mammogram status: patient to schedule  Bone Density status: Completed 10/27/2014. Ordered today  Lung Cancer Screening: (Low Dose CT Chest recommended if Age 68-80years, 30 pack-year currently smoking OR have quit w/in 15years.) does not qualify.   Lung Cancer Screening Referral: no  Additional Screening:  Hepatitis C Screening: does qualify; Completed 07/14/2015  Vision Screening: Recommended annual ophthalmology exams for early detection of glaucoma and other disorders of the eye. Is the patient up to date with their annual eye exam?  Yes  Who is the provider or what is the name of the [facility] which the patient [facility] provider, would they like to be referred to a provider to establish care? No .   Dental Screening: Recommended annual dental exams for proper oral hygiene  Community Resource Referral / Chronic Care Management: CRR required this visit?  No   CCM required this visit?  No      Plan:     I have personally reviewed and noted the following in the patient's chart:   Medical and social history Use of alcohol,  tobacco or illicit drugs  Current medications and supplements including opioid prescriptions. Patient is not currently taking opioid prescriptions. Functional ability and status Nutritional status Physical activity Advanced directives List of other physicians Hospitalizations, surgeries, and ER visits in previous 12 months Vitals Screenings to include cognitive, depression, and falls Referrals and appointments  In addition, I have reviewed and discussed with patient certain preventive protocols, quality metrics, and best practice recommendations. A written personalized care plan for preventive services as well as general preventive health recommendations were provided to patient.     NKellie Simmering LPN   [id number]  Nurse Notes:

## 2021-05-30 NOTE — Patient Instructions (Signed)
Monica Chapman , Thank you for taking time to come for your Medicare Wellness Visit. I appreciate your ongoing commitment to your health goals. Please review the following plan we discussed and let me know if I can assist you in the future.   Screening recommendations/referrals: Colonoscopy: completed 03/12/2019, due 03/11/2022 Mammogram: patient to schedule Bone Density: ordered today Recommended yearly ophthalmology/optometry visit for glaucoma screening and checkup Recommended yearly dental visit for hygiene and checkup  Vaccinations: Influenza vaccine: due Pneumococcal vaccine: due Tdap vaccine: completed 02/01/2014, due 02/02/2024 Shingles vaccine: discussed   Covid-19: 01/09/2020, 12/12/2019  Advanced directives: Advance directive discussed with you today.   Conditions/risks identified: smoking  Next appointment: Follow up in one year for your annual wellness visit    Preventive Care 4 Years and Older, Female Preventive care refers to lifestyle choices and visits with your health care provider that can promote health and wellness. What does preventive care include? A yearly physical exam. This is also called an annual well check. Dental exams once or twice a year. Routine eye exams. Ask your health care provider how often you should have your eyes checked. Personal lifestyle choices, including: Daily care of your teeth and gums. Regular physical activity. Eating a healthy diet. Avoiding tobacco and drug use. Limiting alcohol use. Practicing safe sex. Taking low-dose aspirin every day. Taking vitamin and mineral supplements as recommended by your health care provider. What happens during an annual well check? The services and screenings done by your health care provider during your annual well check will depend on your age, overall health, lifestyle risk factors, and family history of disease. Counseling  Your health care provider may ask you questions about your: Alcohol  use. Tobacco use. Drug use. Emotional well-being. Home and relationship well-being. Sexual activity. Eating habits. History of falls. Memory and ability to understand (cognition). Work and work Statistician. Reproductive health. Screening  You may have the following tests or measurements: Height, weight, and BMI. Blood pressure. Lipid and cholesterol levels. These may be checked every 5 years, or more frequently if you are over 72 years old. Skin check. Lung cancer screening. You may have this screening every year starting at age 30 if you have a 30-pack-year history of smoking and currently smoke or have quit within the past 15 years. Fecal occult blood test (FOBT) of the stool. You may have this test every year starting at age 67. Flexible sigmoidoscopy or colonoscopy. You may have a sigmoidoscopy every 5 years or a colonoscopy every 10 years starting at age 67. Hepatitis C blood test. Hepatitis B blood test. Sexually transmitted disease (STD) testing. Diabetes screening. This is done by checking your blood sugar (glucose) after you have not eaten for a while (fasting). You may have this done every 1-3 years. Bone density scan. This is done to screen for osteoporosis. You may have this done starting at age 10. Mammogram. This may be done every 1-2 years. Talk to your health care provider about how often you should have regular mammograms. Talk with your health care provider about your test results, treatment options, and if necessary, the need for more tests. Vaccines  Your health care provider may recommend certain vaccines, such as: Influenza vaccine. This is recommended every year. Tetanus, diphtheria, and acellular pertussis (Tdap, Td) vaccine. You may need a Td booster every 10 years. Zoster vaccine. You may need this after age 46. Pneumococcal 13-valent conjugate (PCV13) vaccine. One dose is recommended after age 54. Pneumococcal polysaccharide (PPSV23) vaccine. One dose  is  recommended after age 63. Talk to your health care provider about which screenings and vaccines you need and how often you need them. This information is not intended to replace advice given to you by your health care provider. Make sure you discuss any questions you have with your health care provider. Document Released: 10/07/2015 Document Revised: 05/30/2016 Document Reviewed: 07/12/2015 Elsevier Interactive Patient Education  2017 [place] Prevention in the Home Falls can cause injuries. They can happen to people of all ages. There are many things you can do to make your home safe and to help prevent falls. What can I do on the outside of my home? Regularly fix the edges of walkways and driveways and fix any cracks. Remove anything that might make you trip as you walk through a door, such as a raised step or threshold. Trim any bushes or trees on the path to your home. Use bright outdoor lighting. Clear any walking paths of anything that might make someone trip, such as rocks or tools. Regularly check to see if handrails are loose or broken. Make sure that both sides of any steps have handrails. Any raised decks and porches should have guardrails on the edges. Have any leaves, snow, or ice cleared regularly. Use sand or salt on walking paths during winter. Clean up any spills in your garage right away. This includes oil or grease spills. What can I do in the bathroom? Use night lights. Install grab bars by the toilet and in the tub and shower. Do not use towel bars as grab bars. Use non-skid mats or decals in the tub or shower. If you need to sit down in the shower, use a plastic, non-slip stool. Keep the floor dry. Clean up any water that spills on the floor as soon as it happens. Remove soap buildup in the tub or shower regularly. Attach bath mats securely with double-sided non-slip rug tape. Do not have throw rugs and other things on the floor that can make you  trip. What can I do in the bedroom? Use night lights. Make sure that you have a light by your bed that is easy to reach. Do not use any sheets or blankets that are too big for your bed. They should not hang down onto the floor. Have a firm chair that has side arms. You can use this for support while you get dressed. Do not have throw rugs and other things on the floor that can make you trip. What can I do in the kitchen? Clean up any spills right away. Avoid walking on wet floors. Keep items that you use a lot in easy-to-reach places. If you need to reach something above you, use a strong step stool that has a grab bar. Keep electrical cords out of the way. Do not use floor polish or wax that makes floors slippery. If you must use wax, use non-skid floor wax. Do not have throw rugs and other things on the floor that can make you trip. What can I do with my stairs? Do not leave any items on the stairs. Make sure that there are handrails on both sides of the stairs and use them. Fix handrails that are broken or loose. Make sure that handrails are as long as the stairways. Check any carpeting to make sure that it is firmly attached to the stairs. Fix any carpet that is loose or worn. Avoid having throw rugs at the top or bottom of the stairs.  If you do have throw rugs, attach them to the floor with carpet tape. Make sure that you have a light switch at the top of the stairs and the bottom of the stairs. If you do not have them, ask someone to add them for you. What else can I do to help prevent falls? Wear shoes that: Do not have high heels. Have rubber bottoms. Are comfortable and fit you well. Are closed at the toe. Do not wear sandals. If you use a stepladder: Make sure that it is fully opened. Do not climb a closed stepladder. Make sure that both sides of the stepladder are locked into place. Ask someone to hold it for you, if possible. Clearly mark and make sure that you can  see: Any grab bars or handrails. First and last steps. Where the edge of each step is. Use tools that help you move around (mobility aids) if they are needed. These include: Canes. Walkers. Scooters. Crutches. Turn on the lights when you go into a dark area. Replace any light bulbs as soon as they burn out. Set up your furniture so you have a clear path. Avoid moving your furniture around. If any of your floors are uneven, fix them. If there are any pets around you, be aware of where they are. Review your medicines with your doctor. Some medicines can make you feel dizzy. This can increase your chance of falling. Ask your doctor what other things that you can do to help prevent falls. This information is not intended to replace advice given to you by your health care provider. Make sure you discuss any questions you have with your health care provider. Document Released: 07/07/2009 Document Revised: 02/16/2016 Document Reviewed: 10/15/2014 Elsevier Interactive Patient Education  2017 [employer].

## 2021-06-06 ENCOUNTER — Other Ambulatory Visit: Payer: Self-pay

## 2021-06-06 ENCOUNTER — Encounter: Payer: Self-pay | Admitting: Family Medicine

## 2021-06-06 ENCOUNTER — Ambulatory Visit (INDEPENDENT_AMBULATORY_CARE_PROVIDER_SITE_OTHER): Payer: Medicare Other | Admitting: Family Medicine

## 2021-06-06 VITALS — BP 122/55 | HR 73 | Ht 66.0 in | Wt 134.6 lb

## 2021-06-06 DIAGNOSIS — Z23 Encounter for immunization: Secondary | ICD-10-CM

## 2021-06-06 DIAGNOSIS — I10 Essential (primary) hypertension: Secondary | ICD-10-CM | POA: Diagnosis not present

## 2021-06-06 DIAGNOSIS — R7309 Other abnormal glucose: Secondary | ICD-10-CM

## 2021-06-06 DIAGNOSIS — F4322 Adjustment disorder with anxiety: Secondary | ICD-10-CM | POA: Diagnosis not present

## 2021-06-06 DIAGNOSIS — Z1231 Encounter for screening mammogram for malignant neoplasm of breast: Secondary | ICD-10-CM | POA: Diagnosis not present

## 2021-06-06 DIAGNOSIS — F5101 Primary insomnia: Secondary | ICD-10-CM

## 2021-06-06 DIAGNOSIS — M810 Age-related osteoporosis without current pathological fracture: Secondary | ICD-10-CM

## 2021-06-06 DIAGNOSIS — E782 Mixed hyperlipidemia: Secondary | ICD-10-CM | POA: Diagnosis not present

## 2021-06-06 MED ORDER — OMEPRAZOLE 20 MG PO CPDR
20.0000 mg | DELAYED_RELEASE_CAPSULE | Freq: Every day | ORAL | 3 refills | Status: DC
Start: 2021-06-06 — End: 2021-06-12

## 2021-06-06 MED ORDER — LOSARTAN POTASSIUM 50 MG PO TABS: 50.0000 mg | ORAL_TABLET | Freq: Every day | ORAL | 3 refills | Status: DC

## 2021-06-06 MED ORDER — ATORVASTATIN CALCIUM 10 MG PO TABS: ORAL_TABLET | ORAL | 3 refills | Status: DC

## 2021-06-06 MED ORDER — ALENDRONATE SODIUM 70 MG PO TABS: ORAL_TABLET | ORAL | 3 refills | Status: DC

## 2021-06-06 MED ORDER — ESCITALOPRAM OXALATE 10 MG PO TABS: ORAL_TABLET | ORAL | 3 refills | Status: DC

## 2021-06-06 MED ORDER — SHINGRIX 50 MCG/0.5ML IM SUSR: INTRAMUSCULAR | 1 refills | Status: DC

## 2021-06-06 NOTE — Patient Instructions (Addendum)
Thank you for coming to the office today.  For Mammogram + DEXA Scan (Bone mineral density) screening for osteoporosis  Call the [facility] below anytime to schedule your own appointment now that order has been placed.  [facility] [address], [place] [address] Phone: [phone]  [facility] orders.  Future Shingrix shingles vaccine 2-6 months apart. For 2 doses.  Please schedule a Follow-up Appointment to: Return in about 1 year (around 06/06/2022) for 1 year for yearly medicare follow-up [facility] orders after.  If you have any other questions or concerns, please feel free to call the office or send a message through [facility]. You may also schedule an earlier appointment if necessary.  Additionally, you may be receiving a survey about your experience at our office within a few days to 1 week by e-mail or mail. We value your feedback.  Nobie Putnam, DO [place]

## 2021-06-06 NOTE — Progress Notes (Signed)
Subjective:    Patient ID: Monica Chapman, female    DOB: January 19, 1951, 70 y.o.   MRN: [id number]  Monica Chapman is a 70 y.o. female presenting on 06/06/2021 for Anxiety   HPI  Needs all meds refilled  Hypertension Weight loss stabilized  Due for labs She is doing well. Taking Losartan '100mg'$  daily still Appetite is good, eats mostly meat and plenty of vegetables, increasing pasta and starches Has seen GI, had endoscopy and work up no other cause identified, treated GERD   Alcohol consumption / Elevated LFT Due for labs Improved alcohol intake  Hyperlipidemia Needs refill and due for lab  Insomnia Improved Needs refill on Escitalopram   Health Maintenance:  Due for 2nd pneumonia vaccine >1 year after previous vaccine, now to receive Pneumovax-23 today  Due for Shingrix, printed will go to pharmacy  Due for Flu Shot, will receive today     Depression screen [facility] 2/9 06/06/2021 05/30/2021 02/26/2020  Decreased Interest 0 0 0  Down, Depressed, Hopeless 0 0 0  PHQ - 2 Score 0 0 0  Altered sleeping 0 - -  Tired, decreased energy 0 - -  Change in appetite 0 - -  Feeling bad or failure about yourself  0 - -  Trouble concentrating 0 - -  Moving slowly or fidgety/restless 0 - -  Suicidal thoughts 0 - -  PHQ-9 Score 0 - -  Difficult doing work/chores Not difficult at all - -  Some recent data might be hidden    Social History   Tobacco Use   Smoking status: Every Day    Packs/day: 0.25    Types: Cigarettes   Smokeless tobacco: Current   Tobacco comments:    quit in 2017, restarted.   Vaping Use   Vaping Use: Never used  Substance Use Topics   Alcohol use: Yes    Alcohol/week: 21.0 standard drinks    Types: 21 Glasses of wine per week    Comment: 3 glasses wine daily   Drug use: Yes    Types: Marijuana    Comment: occasional     Review of Systems Per HPI unless specifically indicated above     Objective:    BP (!) 122/55   Pulse 73   Ht '5\' 6"'$   (1.676 m)   Wt 134 lb 9.6 oz (61.1 kg)   SpO2 98%   BMI 21.73 kg/m   Wt Readings from Last 3 Encounters:  06/06/21 134 lb 9.6 oz (61.1 kg)  05/30/21 130 lb (59 kg)  02/03/21 135 lb 6.4 oz (61.4 kg)    Physical Exam Vitals and nursing note reviewed.  Constitutional:      General: She is not in acute distress.    Appearance: She is well-developed. She is not diaphoretic.     Comments: Well-appearing, comfortable, cooperative  HENT:     Head: Normocephalic and atraumatic.  Eyes:     General:        Right eye: No discharge.        Left eye: No discharge.     Conjunctiva/sclera: Conjunctivae normal.  Neck:     Thyroid: No thyromegaly.  Cardiovascular:     Rate and Rhythm: Normal rate and regular rhythm.     Heart sounds: Normal heart sounds. No murmur heard. Pulmonary:     Effort: Pulmonary effort is normal. No respiratory distress.     Breath sounds: Normal breath sounds. No wheezing or rales.  Musculoskeletal:  General: Normal range of motion.     Cervical back: Normal range of motion and neck supple.  Lymphadenopathy:     Cervical: No cervical adenopathy.  Skin:    General: Skin is warm and dry.     Findings: No erythema or rash.  Neurological:     Mental Status: She is alert and oriented to person, place, and time.  Psychiatric:        Behavior: Behavior normal.     Comments: Well groomed, good eye contact, normal speech and thoughts   Results for orders placed or performed in visit on 01/25/21  Surgical pathology  Result Value Ref Range   SURGICAL PATHOLOGY      SURGICAL PATHOLOGY CASE: [id number] PATIENT: [name] Surgical Pathology Report     Clinical History: vulvar lesion (cm)     FINAL MICROSCOPIC DIAGNOSIS:  A. LEFT BUTTOCK, BIOPSY: - Epidermal cyst with inflammation and giant cell reaction consistent with rupture. - No invasive carcinoma.  B. RIGHT LABIA, BIOPSY: - Ulcer with active inflammation, granulation tissue and  squamous atypia. - No invasive carcinoma. - See comment.  COMMENT: B. The right labial biopsy shows granulation tissue with acute inflammation consistent with ulcer.  There is associated squamous atypia including prominent koilocytic change and an ulcerated condylomatous lesion is in the differential.  There is no evidence of invasive carcinoma.  Multiple additional levels are examined.  GROSS DESCRIPTION: Specimen A: Received in formalin is a 0.25 cm in diameter and 0.4 cm deep tan skin punch without fat, in toto 1 block.  Specimen B: In formalin is a 0.4 x 0.15 x 0.1 cm tan- red soft tissue, in toto 1 block. SW 01/26/2021  Final Diagnosis performed by Claudette Laws, MD.   Electronically signed 01/30/2021 Technical component performed at [facility]. [facility], [place] [address] [place], [place] [address].  Professional component performed at [facility], [place] [address], [place], [place] [address].  Immunohistochemistry Technical component (if applicable) was performed at [facility]. [address] [place], [place], [place] [address].   IMMUNOHISTOCHEMISTRY DISCLAIMER (if applicable): Some of these immunohistochemical stains may have been developed and the performance characteristics determine by [facility]. Some may not have been cleared or approved by the U.S. Food and Drug Administration. The FDA has determined that such clearance or approval is not necessary. This test is used for clinical purposes. It should not be regarded as investigational or for research. This laboratory is c ertified under the Clinical Laboratory Improvement Amendments of 1988 (CLIA-88) as qualified to perform high complexity clinical laboratory testing.  The controls stained appropriately.       Assessment & Plan:   Problem List Items Addressed This Visit     Persistent adjustment disorder with anxiety   Osteoporosis   Relevant  Medications   alendronate (FOSAMAX) 70 MG tablet   Insomnia   Relevant Medications   escitalopram (LEXAPRO) 10 MG tablet   Hyperlipidemia   Relevant Medications   atorvastatin (LIPITOR) 10 MG tablet   losartan (COZAAR) 50 MG tablet   Other Relevant Orders   Lipid panel   TSH   Comprehensive metabolic panel   Essential hypertension - Primary   Relevant Medications   atorvastatin (LIPITOR) 10 MG tablet   losartan (COZAAR) 50 MG tablet   Other Relevant Orders   CBC with Differential/Platelet   Comprehensive metabolic panel   Other Visit Diagnoses     Encounter for screening mammogram for malignant neoplasm  of breast       Relevant Orders   MM 3D SCREEN BREAST BILATERAL   Need for shingles vaccine       Relevant Medications   SHINGRIX injection   Need for 23-polyvalent pneumococcal polysaccharide vaccine       Relevant Orders   Pneumococcal polysaccharide vaccine 23-valent greater than or equal to 2yo subcutaneous/IM (Completed)   Needs flu shot       Relevant Orders   Flu Vaccine QUAD High Dose(Fluad) (Completed)   Abnormal glucose       Relevant Orders   Hemoglobin A1c      Screening Mammogram ordered Screening DEXA Ordered She will schedule  Flu Vaccine and Pneumovax23 today Future COVID booster  Labs ordered to [facility] given to patient  Refill all meds as above  HTN controlled A1c stable Weight maintained in past >3-4 months. Stable improved nutrition    Orders Placed This Encounter  Procedures   MM 3D SCREEN BREAST BILATERAL    Standing Status:   Future    Standing Expiration Date:   06/06/2022    Order Specific Question:   Reason for Exam (SYMPTOM  OR DIAGNOSIS REQUIRED)    Answer:   Screening bilateral 3D Mammogram Tomo    Order Specific Question:   Preferred imaging location?    Answer:   [facility]   Pneumococcal polysaccharide vaccine 23-valent greater than or equal to 2yo subcutaneous/IM   Flu Vaccine QUAD High Dose(Fluad)   Lipid panel     Order Specific Question:   Has the patient fasted?    Answer:   Yes   CBC with Differential/Platelet   Hemoglobin A1c   TSH   Comprehensive metabolic panel    Order Specific Question:   Has the patient fasted?    Answer:   Yes     Meds ordered this encounter  Medications   SHINGRIX injection    Sig: Inject 0.5 mL into muscle for shingles vaccine. Repeat dose in 2-6 months.    Dispense:  0.5 mL    Refill:  1   alendronate (FOSAMAX) 70 MG tablet    Sig: TAKE 1 TABLET(70 MG) BY MOUTH 1 TIME A WEEK WITH A FULL GLASS OF WATER AND ON AN EMPTY STOMACH    Dispense:  12 tablet    Refill:  3   atorvastatin (LIPITOR) 10 MG tablet    Sig: TAKE 1 TABLET(10 MG) BY MOUTH DAILY AT 6 PM    Dispense:  90 tablet    Refill:  3    **Patient requests 90 days supply**   escitalopram (LEXAPRO) 10 MG tablet    Sig: TAKE 1 TABLET(10 MG) BY MOUTH DAILY    Dispense:  90 tablet    Refill:  3   losartan (COZAAR) 50 MG tablet    Sig: Take 1 tablet (50 mg total) by mouth daily.    Dispense:  90 tablet    Refill:  3   omeprazole (PRILOSEC) 20 MG capsule    Sig: Take 1 capsule (20 mg total) by mouth daily.    Dispense:  90 capsule    Refill:  3      Follow up plan: Return in about 1 year (around 06/06/2022) for 1 year for yearly medicare follow-up [facility] orders after.   Nobie Putnam, [facility] 06/06/2021, 3:15 PM

## 2021-06-07 ENCOUNTER — Other Ambulatory Visit: Payer: Self-pay | Admitting: Family Medicine

## 2021-06-12 ENCOUNTER — Other Ambulatory Visit: Payer: Self-pay

## 2021-06-12 DIAGNOSIS — K2950 Unspecified chronic gastritis without bleeding: Secondary | ICD-10-CM

## 2021-06-12 MED ORDER — OMEPRAZOLE 20 MG PO CPDR: 20.0000 mg | DELAYED_RELEASE_CAPSULE | Freq: Every day | ORAL | 3 refills | Status: DC

## 2021-06-14 LAB — COMPREHENSIVE METABOLIC PANEL
ALT: 30 IU/L (ref 0–32)
AST: 35 IU/L (ref 0–40)
Albumin/Globulin Ratio: 2.1 (ref 1.2–2.2)
Albumin: 4.8 g/dL (ref 3.8–4.8)
Alkaline Phosphatase: 77 IU/L (ref 44–121)
BUN/Creatinine Ratio: 26 (ref 12–28)
BUN: 15 mg/dL (ref 8–27)
Bilirubin Total: 0.5 mg/dL (ref 0.0–1.2)
CO2: 23 mmol/L (ref 20–29)
Calcium: 9.9 mg/dL (ref 8.7–10.3)
Chloride: 98 mmol/L (ref 96–106)
Creatinine, Ser: 0.57 mg/dL (ref 0.57–1.00)
Globulin, Total: 2.3 g/dL (ref 1.5–4.5)
Glucose: 96 mg/dL (ref 65–99)
Potassium: 5.4 mmol/L — ABNORMAL HIGH (ref 3.5–5.2)
Sodium: 137 mmol/L (ref 134–144)
Total Protein: 7.1 g/dL (ref 6.0–8.5)
eGFR: 99 mL/min/{1.73_m2} (ref >59)

## 2021-06-14 LAB — CBC WITH DIFFERENTIAL/PLATELET
Basophils Absolute: 0.1 10*3/uL (ref 0.0–0.2)
Basos: 1 %
EOS (ABSOLUTE): 0.2 10*3/uL (ref 0.0–0.4)
Eos: 3 %
Hematocrit: 39.1 % (ref 34.0–46.6)
Hemoglobin: 13.8 g/dL (ref 11.1–15.9)
Immature Grans (Abs): 0 10*3/uL (ref 0.0–0.1)
Immature Granulocytes: 0 %
Lymphocytes Absolute: 2.2 10*3/uL (ref 0.7–3.1)
Lymphs: 28 %
MCH: 35.4 pg — ABNORMAL HIGH (ref 26.6–33.0)
MCHC: 35.3 g/dL (ref 31.5–35.7)
MCV: 100 fL — ABNORMAL HIGH (ref 79–97)
Monocytes Absolute: 0.6 10*3/uL (ref 0.1–0.9)
Monocytes: 8 %
Neutrophils Absolute: 4.8 10*3/uL (ref 1.4–7.0)
Neutrophils: 60 %
Platelets: 267 10*3/uL (ref 150–450)
RBC: 3.9 x10E6/uL (ref 3.77–5.28)
RDW: 11.4 % — ABNORMAL LOW (ref 11.7–15.4)
WBC: 7.9 10*3/uL (ref 3.4–10.8)

## 2021-06-14 LAB — HEMOGLOBIN A1C
Est. average glucose Bld gHb Est-mCnc: 108 mg/dL
Hgb A1c MFr Bld: 5.4 % (ref 4.8–5.6)

## 2021-06-14 LAB — LIPID PANEL
Chol/HDL Ratio: 2.9 ratio (ref 0.0–4.4)
Cholesterol, Total: 233 mg/dL — ABNORMAL HIGH (ref 100–199)
HDL: 80 mg/dL (ref >39)
LDL Chol Calc (NIH): 133 mg/dL — ABNORMAL HIGH (ref 0–99)
Triglycerides: 115 mg/dL (ref 0–149)
VLDL Cholesterol Cal: 20 mg/dL (ref 5–40)

## 2021-06-14 LAB — TSH: TSH: 2.2 u[IU]/mL (ref 0.450–4.500)

## 2021-06-27 ENCOUNTER — Ambulatory Visit
Admission: RE | Admit: 2021-06-27 | Discharge: 2021-06-27 | Disposition: A | Discharge status: Home / Self Care | Payer: Medicare Other | Source: Ambulatory Visit | Attending: Family Medicine | Admitting: Family Medicine

## 2021-06-27 ENCOUNTER — Other Ambulatory Visit: Payer: Self-pay

## 2021-06-27 DIAGNOSIS — E2839 Other primary ovarian failure: Secondary | ICD-10-CM | POA: Diagnosis not present

## 2021-06-27 DIAGNOSIS — Z1231 Encounter for screening mammogram for malignant neoplasm of breast: Secondary | ICD-10-CM

## 2021-07-27 ENCOUNTER — Other Ambulatory Visit: Payer: Self-pay | Admitting: Family Medicine

## 2021-07-27 DIAGNOSIS — F5101 Primary insomnia: Secondary | ICD-10-CM

## 2021-07-27 NOTE — Telephone Encounter (Signed)
Requested Prescriptions  Refused Prescriptions Disp Refills  . escitalopram (LEXAPRO) 10 MG tablet [Pharmacy Med Name: ESCITALOPRAM 10MG  TABLETS] 90 tablet 3    Sig: TAKE 1 TABLET(10 MG) BY MOUTH DAILY     Psychiatry:  Antidepressants - SSRI Passed - 07/27/2021 12:50 PM      Passed - Valid encounter within last 6 months    Recent Outpatient Visits          1 month ago Essential hypertension   [place], Devonne Doughty, DO   1 year ago Essential hypertension   [facility] Olin Hauser, DO   1 year ago Essential hypertension   [place], DO   2 years ago Unexplained weight loss   [place], DO   2 years ago Poor appetite   [place], DO      Future Appointments            In 10 months [facility], [facility]

## 2021-08-07 ENCOUNTER — Ambulatory Visit: Payer: Federal, State, Local not specified - PPO | Admitting: Obstetrics and Gynecology

## 2021-10-24 ENCOUNTER — Other Ambulatory Visit: Payer: Self-pay | Admitting: Internal Medicine

## 2021-10-24 DIAGNOSIS — E782 Mixed hyperlipidemia: Secondary | ICD-10-CM

## 2021-10-24 NOTE — Telephone Encounter (Signed)
Has newer rx (06/06/21 #90 with 3 RF) at same pharm. Requested Prescriptions  Pending Prescriptions Disp Refills   atorvastatin (LIPITOR) 10 MG tablet [Pharmacy Med Name: ATORVASTATIN 10MG  TABLETS] 90 tablet 3    Sig: TAKE 1 TABLET(10 MG) BY MOUTH DAILY AT 6 PM     Cardiovascular:  Antilipid - Statins Failed - 10/24/2021  1:47 PM      Failed - Lipid Panel in normal range within the last 12 months    Cholesterol, Total  Date Value Ref Range Status  06/13/2021 233 (H) 100 - 199 mg/dL Final   LDL Cholesterol (Calc)  Date Value Ref Range Status  09/04/2017 101 (H) mg/dL (calc) Final    Comment:    Reference range: <100 . Desirable range <100 mg/dL for primary prevention;   <70 mg/dL for patients with CHD or diabetic patients  with > or = 2 CHD risk factors. Marland Kitchen LDL-C is now calculated using the Martin-Hopkins  calculation, which is a validated novel method providing  better accuracy than the Friedewald equation in the  estimation of LDL-C.  Cresenciano Genre et al. Annamaria Helling. 6073;710(62): 2061-2068  (http://education.[facility].com/faq/FAQ164)    LDL Chol Calc (NIH)  Date Value Ref Range Status  06/13/2021 133 (H) 0 - 99 mg/dL Final   HDL  Date Value Ref Range Status  06/13/2021 80 >39 mg/dL Final   Triglycerides  Date Value Ref Range Status  06/13/2021 115 0 - 149 mg/dL Final         Passed - Patient is not pregnant      Passed - Valid encounter within last 12 months    Recent Outpatient Visits          4 months ago Essential hypertension   [place], Devonne Doughty, DO   1 year ago Essential hypertension   [facility] Olin Hauser, DO   1 year ago Essential hypertension   [place], Devonne Doughty, DO   2 years ago Unexplained weight loss   [place], DO   2 years ago Poor appetite   [place], DO       Future Appointments            In 7 months [facility], [facility]

## 2021-11-30 ENCOUNTER — Other Ambulatory Visit: Payer: Self-pay | Admitting: Family Medicine

## 2021-11-30 DIAGNOSIS — I10 Essential (primary) hypertension: Secondary | ICD-10-CM

## 2021-11-30 NOTE — Telephone Encounter (Signed)
Requesting early. Has refills on current rx at same pharm. ?Requested Prescriptions  ?Pending Prescriptions Disp Refills  ?? losartan (COZAAR) 50 MG tablet [Pharmacy Med Name: LOSARTAN '50MG'$  TABLETS] 90 tablet 3  ?  Sig: TAKE 1 TABLET(50 MG) BY MOUTH DAILY  ?  ? Cardiovascular:  Angiotensin Receptor Blockers Failed - 11/30/2021 10:33 AM  ?  ?  Failed - K in normal range and within 180 days  ?  Potassium  ?Date Value Ref Range Status  ?06/13/2021 5.4 (H) 3.5 - 5.2 mmol/L Final  ?   ?  ?  Passed - Cr in normal range and within 180 days  ?  Creat  ?Date Value Ref Range Status  ?09/04/2017 0.86 0.50 - 0.99 mg/dL Final  ?  Comment:  ?  For patients >55 years of age, the reference limit ?for Creatinine is approximately 13% higher for people ?identified as African-American. ?. ?  ? ?Creatinine, Ser  ?Date Value Ref Range Status  ?06/13/2021 0.57 0.57 - 1.00 mg/dL Final  ?   ?  ?  Passed - Patient is not pregnant  ?  ?  Passed - Last BP in normal range  ?  BP Readings from Last 1 Encounters:  ?06/06/21 (!) 122/55  ?   ?  ?  Passed - Valid encounter within last 6 months  ?  Recent Outpatient Visits   ?      ? 5 months ago Essential hypertension  ? [place], DO  ? 1 year ago Essential hypertension  ? [place], DO  ? 1 year ago Essential hypertension  ? [place], DO  ? 2 years ago Unexplained weight loss  ? [place], DO  ? 3 years ago Poor appetite  ? [place], DO  ?  ?  ?Future Appointments   ?        ? In 6 months [facility], [place]   ?  ? ?  ?  ?  ? ? ?

## 2022-01-10 ENCOUNTER — Other Ambulatory Visit: Payer: Self-pay | Admitting: Orthopaedic Surgery

## 2022-01-10 ENCOUNTER — Other Ambulatory Visit: Payer: Self-pay | Admitting: Orthopedic Surgery

## 2022-01-10 ENCOUNTER — Ambulatory Visit
Admission: RE | Admit: 2022-01-10 | Discharge: 2022-01-10 | Disposition: A | Discharge status: Home / Self Care | Payer: Medicare Other | Source: Ambulatory Visit | Attending: Orthopaedic Surgery | Admitting: Orthopaedic Surgery

## 2022-01-10 ENCOUNTER — Other Ambulatory Visit: Payer: Self-pay

## 2022-01-10 DIAGNOSIS — S42401A Unspecified fracture of lower end of right humerus, initial encounter for closed fracture: Secondary | ICD-10-CM | POA: Insufficient documentation

## 2022-01-10 DIAGNOSIS — S52501A Unspecified fracture of the lower end of right radius, initial encounter for closed fracture: Secondary | ICD-10-CM | POA: Diagnosis not present

## 2022-01-10 DIAGNOSIS — S42411A Displaced simple supracondylar fracture without intercondylar fracture of right humerus, initial encounter for closed fracture: Secondary | ICD-10-CM | POA: Diagnosis not present

## 2022-01-10 DIAGNOSIS — S42409A Unspecified fracture of lower end of unspecified humerus, initial encounter for closed fracture: Secondary | ICD-10-CM | POA: Insufficient documentation

## 2022-01-10 DIAGNOSIS — M7989 Other specified soft tissue disorders: Secondary | ICD-10-CM | POA: Diagnosis not present

## 2022-01-16 DIAGNOSIS — S42401A Unspecified fracture of lower end of right humerus, initial encounter for closed fracture: Secondary | ICD-10-CM | POA: Diagnosis not present

## 2022-01-19 DIAGNOSIS — M79631 Pain in right forearm: Secondary | ICD-10-CM | POA: Diagnosis not present

## 2022-01-19 DIAGNOSIS — S42401A Unspecified fracture of lower end of right humerus, initial encounter for closed fracture: Secondary | ICD-10-CM | POA: Diagnosis not present

## 2022-01-19 DIAGNOSIS — G8918 Other acute postprocedural pain: Secondary | ICD-10-CM | POA: Diagnosis not present

## 2022-01-25 DIAGNOSIS — S42401D Unspecified fracture of lower end of right humerus, subsequent encounter for fracture with routine healing: Secondary | ICD-10-CM | POA: Diagnosis not present

## 2022-01-29 DIAGNOSIS — S42401D Unspecified fracture of lower end of right humerus, subsequent encounter for fracture with routine healing: Secondary | ICD-10-CM | POA: Diagnosis not present

## 2022-01-30 DIAGNOSIS — S42401A Unspecified fracture of lower end of right humerus, initial encounter for closed fracture: Secondary | ICD-10-CM | POA: Diagnosis not present

## 2022-02-01 DIAGNOSIS — S42401D Unspecified fracture of lower end of right humerus, subsequent encounter for fracture with routine healing: Secondary | ICD-10-CM | POA: Diagnosis not present

## 2022-02-06 DIAGNOSIS — S42401D Unspecified fracture of lower end of right humerus, subsequent encounter for fracture with routine healing: Secondary | ICD-10-CM | POA: Diagnosis not present

## 2022-02-13 DIAGNOSIS — S42401D Unspecified fracture of lower end of right humerus, subsequent encounter for fracture with routine healing: Secondary | ICD-10-CM | POA: Diagnosis not present

## 2022-02-20 DIAGNOSIS — S42401D Unspecified fracture of lower end of right humerus, subsequent encounter for fracture with routine healing: Secondary | ICD-10-CM | POA: Diagnosis not present

## 2022-02-27 DIAGNOSIS — S42401D Unspecified fracture of lower end of right humerus, subsequent encounter for fracture with routine healing: Secondary | ICD-10-CM | POA: Diagnosis not present

## 2022-03-06 DIAGNOSIS — S42401D Unspecified fracture of lower end of right humerus, subsequent encounter for fracture with routine healing: Secondary | ICD-10-CM | POA: Diagnosis not present

## 2022-04-10 DIAGNOSIS — S42401D Unspecified fracture of lower end of right humerus, subsequent encounter for fracture with routine healing: Secondary | ICD-10-CM | POA: Diagnosis not present

## 2022-04-19 DIAGNOSIS — H9312 Tinnitus, left ear: Secondary | ICD-10-CM | POA: Diagnosis not present

## 2022-04-19 DIAGNOSIS — H903 Sensorineural hearing loss, bilateral: Secondary | ICD-10-CM | POA: Diagnosis not present

## 2022-05-22 ENCOUNTER — Other Ambulatory Visit: Payer: Self-pay | Admitting: Family Medicine

## 2022-05-22 DIAGNOSIS — Z1231 Encounter for screening mammogram for malignant neoplasm of breast: Secondary | ICD-10-CM

## 2022-06-01 ENCOUNTER — Ambulatory Visit (INDEPENDENT_AMBULATORY_CARE_PROVIDER_SITE_OTHER): Payer: Medicare Other

## 2022-06-01 VITALS — BP 132/70 | Ht 66.0 in | Wt 125.8 lb

## 2022-06-01 DIAGNOSIS — Z0000 Encounter for general adult medical examination without abnormal findings: Secondary | ICD-10-CM | POA: Diagnosis not present

## 2022-06-01 NOTE — Patient Instructions (Signed)
Monica Chapman , Thank you for taking time to come for your Medicare Wellness Visit. I appreciate your ongoing commitment to your health goals. Please review the following plan we discussed and let me know if I can assist you in the future.   Screening recommendations/referrals: Colonoscopy: 03/12/19, will refer at a later date Mammogram: 06/28/22 appt set Bone Density: 06/27/21 Recommended yearly ophthalmology/optometry visit for glaucoma screening and checkup Recommended yearly dental visit for hygiene and checkup  Vaccinations: Influenza vaccine: 06/06/21 Pneumococcal vaccine: 06/06/21 Tdap vaccine: 02/01/14 Shingles vaccine: n/d   Covid-19:12/12/19, 01/09/20  Advanced directives: no  Conditions/risks identified: none  Next appointment: Follow up in one year for your annual wellness visit - declined for now   Preventive Care 9 Years and Older, Female Preventive care refers to lifestyle choices and visits with your health care provider that can promote health and wellness. What does preventive care include? A yearly physical exam. This is also called an annual well check. Dental exams once or twice a year. Routine eye exams. Ask your health care provider how often you should have your eyes checked. Personal lifestyle choices, including: Daily care of your teeth and gums. Regular physical activity. Eating a healthy diet. Avoiding tobacco and drug use. Limiting alcohol use. Practicing safe sex. Taking low-dose aspirin every day. Taking vitamin and mineral supplements as recommended by your health care provider. What happens during an annual well check? The services and screenings done by your health care provider during your annual well check will depend on your age, overall health, lifestyle risk factors, and family history of disease. Counseling  Your health care provider may ask you questions about your: Alcohol use. Tobacco use. Drug use. Emotional well-being. Home and  relationship well-being. Sexual activity. Eating habits. History of falls. Memory and ability to understand (cognition). Work and work Statistician. Reproductive health. Screening  You may have the following tests or measurements: Height, weight, and BMI. Blood pressure. Lipid and cholesterol levels. These may be checked every 5 years, or more frequently if you are over 43 years old. Skin check. Lung cancer screening. You may have this screening every year starting at age 41 if you have a 30-pack-year history of smoking and currently smoke or have quit within the past 15 years. Fecal occult blood test (FOBT) of the stool. You may have this test every year starting at age 30. Flexible sigmoidoscopy or colonoscopy. You may have a sigmoidoscopy every 5 years or a colonoscopy every 10 years starting at age 81. Hepatitis C blood test. Hepatitis B blood test. Sexually transmitted disease (STD) testing. Diabetes screening. This is done by checking your blood sugar (glucose) after you have not eaten for a while (fasting). You may have this done every 1-3 years. Bone density scan. This is done to screen for osteoporosis. You may have this done starting at age 59. Mammogram. This may be done every 1-2 years. Talk to your health care provider about how often you should have regular mammograms. Talk with your health care provider about your test results, treatment options, and if necessary, the need for more tests. Vaccines  Your health care provider may recommend certain vaccines, such as: Influenza vaccine. This is recommended every year. Tetanus, diphtheria, and acellular pertussis (Tdap, Td) vaccine. You may need a Td booster every 10 years. Zoster vaccine. You may need this after age 58. Pneumococcal 13-valent conjugate (PCV13) vaccine. One dose is recommended after age 17. Pneumococcal polysaccharide (PPSV23) vaccine. One dose is recommended after age 57.  Talk to your health care provider  about which screenings and vaccines you need and how often you need them. This information is not intended to replace advice given to you by your health care provider. Make sure you discuss any questions you have with your health care provider. Document Released: 10/07/2015 Document Revised: 05/30/2016 Document Reviewed: 07/12/2015 Elsevier Interactive Patient Education  2017 [place] Prevention in the Home Falls can cause injuries. They can happen to people of all ages. There are many things you can do to make your home safe and to help prevent falls. What can I do on the outside of my home? Regularly fix the edges of walkways and driveways and fix any cracks. Remove anything that might make you trip as you walk through a door, such as a raised step or threshold. Trim any bushes or trees on the path to your home. Use bright outdoor lighting. Clear any walking paths of anything that might make someone trip, such as rocks or tools. Regularly check to see if handrails are loose or broken. Make sure that both sides of any steps have handrails. Any raised decks and porches should have guardrails on the edges. Have any leaves, snow, or ice cleared regularly. Use sand or salt on walking paths during winter. Clean up any spills in your garage right away. This includes oil or grease spills. What can I do in the bathroom? Use night lights. Install grab bars by the toilet and in the tub and shower. Do not use towel bars as grab bars. Use non-skid mats or decals in the tub or shower. If you need to sit down in the shower, use a plastic, non-slip stool. Keep the floor dry. Clean up any water that spills on the floor as soon as it happens. Remove soap buildup in the tub or shower regularly. Attach bath mats securely with double-sided non-slip rug tape. Do not have throw rugs and other things on the floor that can make you trip. What can I do in the bedroom? Use night lights. Make sure  that you have a light by your bed that is easy to reach. Do not use any sheets or blankets that are too big for your bed. They should not hang down onto the floor. Have a firm chair that has side arms. You can use this for support while you get dressed. Do not have throw rugs and other things on the floor that can make you trip. What can I do in the kitchen? Clean up any spills right away. Avoid walking on wet floors. Keep items that you use a lot in easy-to-reach places. If you need to reach something above you, use a strong step stool that has a grab bar. Keep electrical cords out of the way. Do not use floor polish or wax that makes floors slippery. If you must use wax, use non-skid floor wax. Do not have throw rugs and other things on the floor that can make you trip. What can I do with my stairs? Do not leave any items on the stairs. Make sure that there are handrails on both sides of the stairs and use them. Fix handrails that are broken or loose. Make sure that handrails are as long as the stairways. Check any carpeting to make sure that it is firmly attached to the stairs. Fix any carpet that is loose or worn. Avoid having throw rugs at the top or bottom of the stairs. If you do have throw  rugs, attach them to the floor with carpet tape. Make sure that you have a light switch at the top of the stairs and the bottom of the stairs. If you do not have them, ask someone to add them for you. What else can I do to help prevent falls? Wear shoes that: Do not have high heels. Have rubber bottoms. Are comfortable and fit you well. Are closed at the toe. Do not wear sandals. If you use a stepladder: Make sure that it is fully opened. Do not climb a closed stepladder. Make sure that both sides of the stepladder are locked into place. Ask someone to hold it for you, if possible. Clearly mark and make sure that you can see: Any grab bars or handrails. First and last steps. Where the edge of  each step is. Use tools that help you move around (mobility aids) if they are needed. These include: Canes. Walkers. Scooters. Crutches. Turn on the lights when you go into a dark area. Replace any light bulbs as soon as they burn out. Set up your furniture so you have a clear path. Avoid moving your furniture around. If any of your floors are uneven, fix them. If there are any pets around you, be aware of where they are. Review your medicines with your doctor. Some medicines can make you feel dizzy. This can increase your chance of falling. Ask your doctor what other things that you can do to help prevent falls. This information is not intended to replace advice given to you by your health care provider. Make sure you discuss any questions you have with your health care provider. Document Released: 07/07/2009 Document Revised: 02/16/2016 Document Reviewed: 10/15/2014 Elsevier Interactive Patient Education  2017 [employer].

## 2022-06-01 NOTE — Progress Notes (Signed)
Subjective:   Monica Chapman is a 71 y.o. female who presents for Medicare Annual (Subsequent) preventive examination.  Review of Systems     Cardiac Risk Factors include: advanced age (>33mn, >>43women);dyslipidemia;hypertension;smoking/ tobacco exposure     Objective:    Today's Vitals   06/01/22 1358  BP: 132/70  Weight: 125 lb 12.8 oz (57.1 kg)  Height: '5\' 6"'$  (1.676 m)   Body mass index is 20.3 kg/m.     06/01/2022    2:08 PM 05/30/2021    2:41 PM 08/11/2019   11:50 AM 03/12/2019   12:21 PM  Advanced Directives  Does Patient Have a Medical Advance Directive? No No Yes No  Type of Advance Directive   Living will;Healthcare Power of [place] Chart?   No - copy requested   Would patient like information on creating a medical advance directive? No - Patient declined   No - Patient declined    Current Medications (verified) Outpatient Encounter Medications as of 06/01/2022  Medication Sig   alendronate (FOSAMAX) 70 MG tablet TAKE 1 TABLET(70 MG) BY MOUTH 1 TIME A WEEK WITH A FULL GLASS OF WATER AND ON AN EMPTY STOMACH   atorvastatin (LIPITOR) 10 MG tablet TAKE 1 TABLET(10 MG) BY MOUTH DAILY AT 6 PM   atorvastatin (LIPITOR) 20 MG tablet    calcium-vitamin D (OSCAL WITH D) 500-200 MG-UNIT per tablet Take 1 tablet by mouth.   escitalopram (LEXAPRO) 10 MG tablet TAKE 1 TABLET(10 MG) BY MOUTH DAILY   ibuprofen (ADVIL) 600 MG tablet Take 1 tablet by mouth 3 (three) times daily.   Influenza Vac A&B Surf Ant Adj 0.5 ML SUSY inject 0.5 milliliter intramuscularly   losartan (COZAAR) 100 MG tablet    losartan (COZAAR) 50 MG tablet Take 1 tablet (50 mg total) by mouth daily.   Melatonin 3 MG TABS Take by mouth as needed.   omeprazole (PRILOSEC) 20 MG capsule Take 1 capsule (20 mg total) by mouth daily.   SHINGRIX injection Inject 0.5 mL into muscle for shingles vaccine. Repeat dose in 2-6 months.   baclofen (LIORESAL) 10 MG tablet  (Patient  not taking: Reported on 06/01/2022)   buPROPion (WELLBUTRIN XL) 150 MG 24 hr tablet  (Patient not taking: Reported on 06/01/2022)   Cetirizine HCl 10 MG CAPS Take 10 mg by mouth at bedtime.  (Patient not taking: Reported on 06/01/2022)   clindamycin (CLEOCIN) 150 MG capsule  (Patient not taking: Reported on 06/01/2022)   etodolac (LODINE) 500 MG tablet  (Patient not taking: Reported on 06/01/2022)   hydrochlorothiazide (HYDRODIURIL) 12.5 MG tablet  (Patient not taking: Reported on 06/01/2022)   HYDROcodone-acetaminophen (NORCO/VICODIN) 5-325 MG tablet TAKE 1 TABLET BY MOUTH EVERY 6 HOURS FOR 5 DAYS AS NEEDED (Patient not taking: Reported on 06/01/2022)   HYDROmorphone (DILAUDID) 2 MG tablet  (Patient not taking: Reported on 06/01/2022)   meloxicam (MOBIC) 15 MG tablet  (Patient not taking: Reported on 06/01/2022)   ondansetron (ZOFRAN-ODT) 4 MG disintegrating tablet DISSOLVE 1 TABLET ON THE TONGUE EVERY 12 HOURS AS NEEDED FOR NAUSEA (Patient not taking: Reported on 06/01/2022)   oxyCODONE (OXY IR/ROXICODONE) 5 MG immediate release tablet TAKE 1 TABLET BY MOUTH EVERY 4 HOURS (Patient not taking: Reported on 06/01/2022)   oxyCODONE-acetaminophen (PERCOCET) 10-325 MG tablet  (Patient not taking: Reported on 06/01/2022)   promethazine (PHENERGAN) 25 MG tablet  (Patient not taking: Reported on 06/01/2022)   traMADol (ULTRAM) 50 MG  tablet  (Patient not taking: Reported on 06/01/2022)   No facility-administered encounter medications on file as of 06/01/2022.    Allergies (verified) Penicillins   History: Past Medical History:  Diagnosis Date   Arthritis    Barrett esophagus    Hypertension    Past Surgical History:  Procedure Laterality Date   ABDOMINAL HYSTERECTOMY     COLONOSCOPY WITH PROPOFOL N/A 03/12/2019   Procedure: COLONOSCOPY WITH PROPOFOL;  Surgeon: Virgel Manifold, MD;  Location: [facility] ENDOSCOPY;  Service: Endoscopy;  Laterality: N/A;   ESOPHAGOGASTRODUODENOSCOPY (EGD) WITH PROPOFOL N/A 03/12/2019    Procedure: ESOPHAGOGASTRODUODENOSCOPY (EGD) WITH PROPOFOL;  Surgeon: Virgel Manifold, MD;  Location: [facility] ENDOSCOPY;  Service: Endoscopy;  Laterality: N/A;   FOOT SURGERY     HAND SURGERY     Family History  Problem Relation Age of Onset   Breast cancer Mother 20   Breast cancer Maternal Grandmother    Social History   Socioeconomic History   Marital status: Married    Spouse name: Not on file   Number of children: Not on file   Years of education: Not on file   Highest education level: High school graduate  Occupational History   Not on file  Tobacco Use   Smoking status: Every Day    Packs/day: 0.25    Types: Cigarettes   Smokeless tobacco: Current   Tobacco comments:    quit in 2017, restarted.   Vaping Use   Vaping Use: Never used  Substance and Sexual Activity   Alcohol use: Yes    Alcohol/week: 21.0 standard drinks of alcohol    Types: 21 Glasses of wine per week    Comment: 3 glasses wine daily   Drug use: Yes    Types: Marijuana    Comment: occasional    Sexual activity: Not Currently  Other Topics Concern   Not on file  Social History Narrative   Not on file   Social Determinants of Health   Financial Resource Strain: Low Risk  (06/01/2022)   Overall Financial Resource Strain (CARDIA)    Difficulty of Paying Living Expenses: Not hard at all  Food Insecurity: No Food Insecurity (06/01/2022)   Hunger Vital Sign    Worried About Running Out of Food in the Last Year: Never true    Ran Out of Food in the Last Year: Never true  Transportation Needs: No Transportation Needs (06/01/2022)   PRAPARE - Hydrologist (Medical): No    Lack of Transportation (Non-Medical): No  Physical Activity: Insufficiently Active (06/01/2022)   Exercise Vital Sign    Days of Exercise per Week: 3 days    Minutes of Exercise per Session: 30 min  Stress: No Stress Concern Present (06/01/2022)   [place]    Feeling of Stress : Not at all  Social Connections: Moderately Isolated (06/01/2022)   Social Connection and Isolation Panel [NHANES]    Frequency of Communication with Friends and Family: Twice a week    Frequency of Social Gatherings with Friends and Family: Once a week    Attends Religious Services: Never    Marine scientist or Organizations: No    Attends Archivist Meetings: Never    Marital Status: Married    Tobacco Counseling Ready to quit: Not Answered Counseling given: Not Answered Tobacco comments: quit in 2017, restarted.    Clinical Intake:  Pre-visit preparation completed: Yes  Pain :  No/denies pain     Nutritional Risks: None Diabetes: No  How often do you need to have someone help you when you read instructions, pamphlets, or other written materials from your doctor or pharmacy?: 1 - Never  Diabetic?no  Interpreter Needed?: No  Information entered by :: Kirke Shaggy, LPN   Activities of Daily Living    06/01/2022    2:10 PM  In your present state of health, do you have any difficulty performing the following activities:  Hearing? 0  Vision? 0  Difficulty concentrating or making decisions? 0  Walking or climbing stairs? 0  Dressing or bathing? 0  Doing errands, shopping? 0  Preparing Food and eating ? N  Using the Toilet? N  In the past six months, have you accidently leaked urine? N  Do you have problems with loss of bowel control? N  Managing your Medications? N  Managing your Finances? N  Housekeeping or managing your Housekeeping? N    Patient Care Team: Olin Hauser, DO as PCP - General (Family Medicine) Virgel Manifold, MD (Inactive) as Consulting Physician (Gastroenterology)  Indicate any recent Medical Services you may have received from other than [facility] providers in the past year (date may be approximate).     Assessment:   This is a routine wellness examination for  Dorisann.  Hearing/Vision screen Hearing Screening - Comments:: No aids Vision Screening - Comments:: Readers- Dr. Nancie Neas  Dietary issues and exercise activities discussed: Current Exercise Habits: Home exercise routine, Type of exercise: walking, Time (Minutes): 30, Frequency (Times/Week): 3, Weekly Exercise (Minutes/Week): 90, Intensity: Mild   Goals Addressed             This Visit's Progress    DIET - EAT MORE FRUITS AND VEGETABLES         Depression Screen    06/01/2022    2:06 PM 06/06/2021    2:52 PM 05/30/2021    2:42 PM 02/26/2020   11:09 AM 08/11/2019   11:48 AM 02/20/2019   10:00 AM 11/07/2018    9:36 AM  PHQ 2/9 Scores  PHQ - 2 Score 0 0 0 0 0 0   PHQ- 9 Score 0 0    4   Exception Documentation       Patient refusal    Fall Risk    06/01/2022    2:09 PM 06/06/2021    2:52 PM 05/30/2021    2:42 PM 02/26/2020   11:08 AM 08/11/2019   11:48 AM  Fall Risk   Falls in the past year? 1 0 0 0 0  Number falls in past yr: 0 0  0 0  Injury with Fall? 1 0  0 0  Risk for fall due to : History of fall(s)  Medication side effect    Follow up Falls evaluation completed;Falls prevention discussed Falls evaluation completed Falls evaluation completed;Education provided;Falls prevention discussed Falls evaluation completed     FALL RISK PREVENTION PERTAINING TO THE HOME:  Any stairs in or around the home? No  If so, are there any without handrails? No  Home free of loose throw rugs in walkways, pet beds, electrical cords, etc? Yes  Adequate lighting in your home to reduce risk of falls? Yes   ASSISTIVE DEVICES UTILIZED TO PREVENT FALLS:  Life alert? No  Use of a cane, walker or w/c? No  Grab bars in the bathroom? No  Shower chair or bench in shower? Yes  Elevated toilet seat or a handicapped toilet?  Yes   TIMED UP AND GO:  Was the test performed? Yes .  Length of time to ambulate 10 feet: 4 sec.   Gait steady and fast without use of assistive device  Cognitive  Function:        06/01/2022    2:18 PM 05/30/2021    2:43 PM 08/11/2019   11:48 AM  6CIT Screen  What Year? 0 points 0 points 0 points  What month? 0 points 0 points 0 points  What time? 0 points 0 points 0 points  Count back from 20 0 points 0 points 0 points  Months in reverse 0 points 0 points 0 points  Repeat phrase 0 points 0 points 0 points  Total Score 0 points 0 points 0 points    Immunizations Immunization History  Administered Date(s) Administered   Fluad Quad(high Dose 65+) 07/07/2019, 06/02/2020, 06/06/2021   Influenza, High Dose Seasonal PF 11/07/2018   Influenza-Unspecified 06/22/2016   Moderna Sars-Covid-2 Vaccination 12/12/2019, 01/09/2020   Pneumococcal Conjugate-13 08/11/2019   Pneumococcal Polysaccharide-23 06/06/2021    TDAP status: Up to date  Flu Vaccine status: Up to date  Pneumococcal vaccine status: Up to date  Covid-19 vaccine status: Completed vaccines  Qualifies for Shingles Vaccine? Yes   Zostavax completed No   Shingrix Completed?: No.    Education has been provided regarding the importance of this vaccine. Patient has been advised to call insurance company to determine out of pocket expense if they have not yet received this vaccine. Advised may also receive vaccine at local pharmacy or Health Dept. Verbalized acceptance and understanding.  Screening Tests Health Maintenance  Topic Date Due   Zoster Vaccines- Shingrix (1 of 2) Never done   COVID-19 Vaccine (3 - Moderna series) 03/05/2020   COLONOSCOPY (Pts 45-5yr Insurance coverage will need to be confirmed)  03/11/2022   INFLUENZA VACCINE  04/24/2022   MAMMOGRAM  06/28/2023   TETANUS/TDAP  02/02/2024   Pneumonia Vaccine 71 Years old  Completed   DEXA SCAN  Completed   Hepatitis C Screening  Completed   HPV VACCINES  Aged Out    Health Maintenance  Health Maintenance Due  Topic Date Due   Zoster Vaccines- Shingrix (1 of 2) Never done   COVID-19 Vaccine (3 - Moderna series)  03/05/2020   COLONOSCOPY (Pts 45-455yrInsurance coverage will need to be confirmed)  03/11/2022   INFLUENZA VACCINE  04/24/2022    Colorectal cancer screening: Type of screening: Colonoscopy. Completed 03/12/19. Repeat every 3 years- declined referral for now  Mammogram status: Ordered 06/28/22. Pt provided with contact info and advised to call to schedule appt. - has appt 10/5  Bone Density status: Completed 06/27/21. Results reflect: Bone density results: OSTEOPOROSIS. Repeat every 2 years.  Lung Cancer Screening: (Low Dose CT Chest recommended if Age 36109-80ears, 30 pack-year currently smoking OR have quit w/in 15years.) does qualify.    Additional Screening:  Hepatitis C Screening: does qualify; Completed 07/14/15  Vision Screening: Recommended annual ophthalmology exams for early detection of glaucoma and other disorders of the eye. Is the patient up to date with their annual eye exam?  Yes  Who is the provider or what is the name of the office in which the patient attends annual eye exams? Dr.Totten If pt is not established with a provider, would they like to be referred to a provider to establish care? No .   Dental Screening: Recommended annual dental exams for proper oral hygiene  Community Resource Referral /  Chronic Care Management: CRR required this visit?  No   CCM required this visit?  No      Plan:     I have personally reviewed and noted the following in the patient's chart:   Medical and social history Use of alcohol, tobacco or illicit drugs  Current medications and supplements including opioid prescriptions. Patient is not currently taking opioid prescriptions. Functional ability and status Nutritional status Physical activity Advanced directives List of other physicians Hospitalizations, surgeries, and ER visits in previous 12 months Vitals Screenings to include cognitive, depression, and falls Referrals and appointments  In addition, I have  reviewed and discussed with patient certain preventive protocols, quality metrics, and best practice recommendations. A written personalized care plan for preventive services as well as general preventive health recommendations were provided to patient.     Dionisio David, LPN   11/27/7015   Nurse Notes: none

## 2022-06-05 ENCOUNTER — Ambulatory Visit: Payer: Federal, State, Local not specified - PPO

## 2022-06-28 ENCOUNTER — Ambulatory Visit
Admission: RE | Admit: 2022-06-28 | Discharge: 2022-06-28 | Disposition: A | Discharge status: Home / Self Care | Payer: Medicare Other | Source: Ambulatory Visit | Attending: Family Medicine | Admitting: Family Medicine

## 2022-06-28 DIAGNOSIS — Z1231 Encounter for screening mammogram for malignant neoplasm of breast: Secondary | ICD-10-CM | POA: Insufficient documentation

## 2022-07-18 ENCOUNTER — Telehealth: Payer: Self-pay | Admitting: *Deleted

## 2022-07-18 ENCOUNTER — Other Ambulatory Visit: Payer: Self-pay | Admitting: *Deleted

## 2022-07-18 DIAGNOSIS — Z86010 Personal history of colonic polyps: Secondary | ICD-10-CM

## 2022-07-18 MED ORDER — PEG 3350-KCL-NABCB-NACL-NASULF 236 G PO SOLR: ORAL | 0 refills | Status: DC

## 2022-07-18 NOTE — Telephone Encounter (Signed)
Gastroenterology Pre-Procedure Review  Request Date: 08/08/2022 Requesting Physician: Dr. Vicente Males  PATIENT REVIEW QUESTIONS: The patient responded to the following health history questions as indicated:    1. Are you having any GI issues? no 2. Do you have a personal history of Polyps? yes (last colonoscopy 03/12/2019) 3. Do you have a family history of Colon Cancer or Polyps? no 4. Diabetes Mellitus? no 5. Joint replacements in the past 12 months?no 6. Major health problems in the past 3 months?no 7. Any artificial heart valves, MVP, or defibrillator?no    MEDICATIONS & ALLERGIES:    Patient reports the following regarding taking any anticoagulation/antiplatelet therapy:   Plavix, Coumadin, Eliquis, Xarelto, Lovenox, Pradaxa, Brilinta, or Effient? no Aspirin? no  Patient confirms/reports the following medications:  Current Outpatient Medications  Medication Sig Dispense Refill   alendronate (FOSAMAX) 70 MG tablet TAKE 1 TABLET(70 MG) BY MOUTH 1 TIME A WEEK WITH A FULL GLASS OF WATER AND ON AN EMPTY STOMACH 12 tablet 3   atorvastatin (LIPITOR) 10 MG tablet TAKE 1 TABLET(10 MG) BY MOUTH DAILY AT 6 PM 90 tablet 3   atorvastatin (LIPITOR) 20 MG tablet      baclofen (LIORESAL) 10 MG tablet  (Patient not taking: Reported on 06/01/2022)     buPROPion (WELLBUTRIN XL) 150 MG 24 hr tablet  (Patient not taking: Reported on 06/01/2022)     calcium-vitamin D (OSCAL WITH D) 500-200 MG-UNIT per tablet Take 1 tablet by mouth.     Cetirizine HCl 10 MG CAPS Take 10 mg by mouth at bedtime.  (Patient not taking: Reported on 06/01/2022)     clindamycin (CLEOCIN) 150 MG capsule  (Patient not taking: Reported on 06/01/2022)     escitalopram (LEXAPRO) 10 MG tablet TAKE 1 TABLET(10 MG) BY MOUTH DAILY 90 tablet 3   etodolac (LODINE) 500 MG tablet  (Patient not taking: Reported on 06/01/2022)     hydrochlorothiazide (HYDRODIURIL) 12.5 MG tablet  (Patient not taking: Reported on 06/01/2022)     HYDROcodone-acetaminophen  (NORCO/VICODIN) 5-325 MG tablet TAKE 1 TABLET BY MOUTH EVERY 6 HOURS FOR 5 DAYS AS NEEDED (Patient not taking: Reported on 06/01/2022)     HYDROmorphone (DILAUDID) 2 MG tablet  (Patient not taking: Reported on 06/01/2022)     ibuprofen (ADVIL) 600 MG tablet Take 1 tablet by mouth 3 (three) times daily.     Influenza Vac A&B Surf Ant Adj 0.5 ML SUSY inject 0.5 milliliter intramuscularly     losartan (COZAAR) 100 MG tablet      losartan (COZAAR) 50 MG tablet Take 1 tablet (50 mg total) by mouth daily. 90 tablet 3   Melatonin 3 MG TABS Take by mouth as needed.     meloxicam (MOBIC) 15 MG tablet  (Patient not taking: Reported on 06/01/2022)     omeprazole (PRILOSEC) 20 MG capsule Take 1 capsule (20 mg total) by mouth daily. 90 capsule 3   ondansetron (ZOFRAN-ODT) 4 MG disintegrating tablet DISSOLVE 1 TABLET ON THE TONGUE EVERY 12 HOURS AS NEEDED FOR NAUSEA (Patient not taking: Reported on 06/01/2022)     oxyCODONE (OXY IR/ROXICODONE) 5 MG immediate release tablet TAKE 1 TABLET BY MOUTH EVERY 4 HOURS (Patient not taking: Reported on 06/01/2022)     oxyCODONE-acetaminophen (PERCOCET) 10-325 MG tablet  (Patient not taking: Reported on 06/01/2022)     promethazine (PHENERGAN) 25 MG tablet  (Patient not taking: Reported on 06/01/2022)     SHINGRIX injection Inject 0.5 mL into muscle for shingles vaccine. Repeat dose in  2-6 months. 0.5 mL 1   traMADol (ULTRAM) 50 MG tablet  (Patient not taking: Reported on 06/01/2022)     No current facility-administered medications for this visit.    Patient confirms/reports the following allergies:  Allergies  Allergen Reactions   Penicillins Itching    At about 71 yo with itching not serious. Did not require medical care    No orders of the defined types were placed in this encounter.   AUTHORIZATION INFORMATION Primary Insurance: 1D#: Group #:  Secondary Insurance: 1D#: Group #:  SCHEDULE INFORMATION: Date: 08/08/2022 Time: Location: [facility]

## 2022-07-24 ENCOUNTER — Other Ambulatory Visit: Payer: Self-pay | Admitting: Family Medicine

## 2022-07-24 DIAGNOSIS — K2950 Unspecified chronic gastritis without bleeding: Secondary | ICD-10-CM

## 2022-07-24 NOTE — Telephone Encounter (Signed)
Requested Prescriptions  Pending Prescriptions Disp Refills  . omeprazole (PRILOSEC) 20 MG capsule [Pharmacy Med Name: OMEPRAZOLE '20MG'$  CAPSULES] 90 capsule 3    Sig: TAKE 1 CAPSULE(20 MG) BY MOUTH DAILY     Gastroenterology: Proton Pump Inhibitors Passed - 07/24/2022 11:05 AM      Passed - Valid encounter within last 12 months    Recent Outpatient Visits          1 year ago Essential hypertension   [place], Devonne Doughty, DO   2 years ago Essential hypertension   [facility] Olin Hauser, DO   2 years ago Essential hypertension   [place], DO   3 years ago Unexplained weight loss   [place], DO   3 years ago Poor appetite   [place], [place]

## 2022-08-08 ENCOUNTER — Ambulatory Visit: Payer: Medicare Other | Admitting: Anesthesiology

## 2022-08-08 ENCOUNTER — Encounter: Payer: Self-pay | Admitting: Gastroenterology

## 2022-08-08 ENCOUNTER — Encounter
Admission: RE | Disposition: A | Discharge status: Home / Self Care | Payer: Self-pay | Source: Home / Self Care | Attending: Gastroenterology

## 2022-08-08 ENCOUNTER — Ambulatory Visit
Admission: RE | Admit: 2022-08-08 | Discharge: 2022-08-08 | Disposition: A | Discharge status: Home / Self Care | Payer: Medicare Other | Attending: Gastroenterology | Admitting: Gastroenterology

## 2022-08-08 DIAGNOSIS — D124 Benign neoplasm of descending colon: Secondary | ICD-10-CM | POA: Diagnosis not present

## 2022-08-08 DIAGNOSIS — Z1211 Encounter for screening for malignant neoplasm of colon: Secondary | ICD-10-CM | POA: Diagnosis not present

## 2022-08-08 DIAGNOSIS — Z860100 Personal history of colon polyps, unspecified: Secondary | ICD-10-CM

## 2022-08-08 DIAGNOSIS — K5730 Diverticulosis of large intestine without perforation or abscess without bleeding: Secondary | ICD-10-CM | POA: Insufficient documentation

## 2022-08-08 DIAGNOSIS — I10 Essential (primary) hypertension: Secondary | ICD-10-CM | POA: Diagnosis not present

## 2022-08-08 DIAGNOSIS — D122 Benign neoplasm of ascending colon: Secondary | ICD-10-CM | POA: Diagnosis not present

## 2022-08-08 DIAGNOSIS — D126 Benign neoplasm of colon, unspecified: Secondary | ICD-10-CM | POA: Diagnosis not present

## 2022-08-08 DIAGNOSIS — D120 Benign neoplasm of cecum: Secondary | ICD-10-CM | POA: Insufficient documentation

## 2022-08-08 DIAGNOSIS — D125 Benign neoplasm of sigmoid colon: Secondary | ICD-10-CM | POA: Diagnosis not present

## 2022-08-08 DIAGNOSIS — F17200 Nicotine dependence, unspecified, uncomplicated: Secondary | ICD-10-CM | POA: Diagnosis not present

## 2022-08-08 HISTORY — PX: COLONOSCOPY WITH PROPOFOL: SHX5780

## 2022-08-08 SURGERY — COLONOSCOPY WITH PROPOFOL
Anesthesia: General

## 2022-08-08 MED ORDER — MIDAZOLAM HCL 2 MG/2ML IJ SOLN
INTRAMUSCULAR | Status: AC
  Filled 2022-08-08: qty 2

## 2022-08-08 MED ORDER — PHENYLEPHRINE 80 MCG/ML (10ML) SYRINGE FOR IV PUSH (FOR BLOOD PRESSURE SUPPORT)
PREFILLED_SYRINGE | INTRAVENOUS | Status: DC | PRN
  Administered 2022-08-08: 160 ug via INTRAVENOUS
  Administered 2022-08-08: 240 ug via INTRAVENOUS
  Administered 2022-08-08: 80 ug via INTRAVENOUS
  Administered 2022-08-08: 160 ug via INTRAVENOUS

## 2022-08-08 MED ORDER — ATROPINE SULFATE 0.4 MG/ML IV SOLN
INTRAVENOUS | Status: AC
  Filled 2022-08-08: qty 1

## 2022-08-08 MED ORDER — MIDAZOLAM HCL 2 MG/2ML IJ SOLN
INTRAMUSCULAR | Status: DC | PRN
  Administered 2022-08-08: 2 mg via INTRAVENOUS

## 2022-08-08 MED ORDER — SODIUM CHLORIDE 0.9 % IV SOLN: INTRAVENOUS | Status: DC

## 2022-08-08 MED ORDER — PHENYLEPHRINE 80 MCG/ML (10ML) SYRINGE FOR IV PUSH (FOR BLOOD PRESSURE SUPPORT)
PREFILLED_SYRINGE | INTRAVENOUS | Status: AC
  Filled 2022-08-08: qty 10

## 2022-08-08 MED ORDER — PROPOFOL 500 MG/50ML IV EMUL
INTRAVENOUS | Status: DC | PRN
  Administered 2022-08-08: 50 ug/kg/min via INTRAVENOUS

## 2022-08-08 MED ORDER — PROPOFOL 10 MG/ML IV BOLUS
INTRAVENOUS | Status: DC | PRN
  Administered 2022-08-08: 20 mg via INTRAVENOUS
  Administered 2022-08-08: 30 mg via INTRAVENOUS
  Administered 2022-08-08: 50 mg via INTRAVENOUS

## 2022-08-08 MED ORDER — LIDOCAINE HCL (CARDIAC) PF 100 MG/5ML IV SOSY
PREFILLED_SYRINGE | INTRAVENOUS | Status: DC | PRN
  Administered 2022-08-08: 50 mg via INTRAVENOUS

## 2022-08-08 MED ORDER — SIMETHICONE 40 MG/0.6ML PO SUSP
ORAL | Status: DC | PRN
  Administered 2022-08-08: 120 mL

## 2022-08-08 MED ORDER — PROPOFOL 10 MG/ML IV BOLUS
INTRAVENOUS | Status: AC
  Filled 2022-08-08: qty 20

## 2022-08-08 NOTE — H&P (Signed)
Jonathon Bellows, MD [address], [place], [place], [place], [address] [address], [place], [place], [place], [address] Phone: [phone]  Fax: [phone]  Primary Care Physician:  Olin Hauser, DO   Pre-Procedure History & Physical: HPI:  Monica Chapman is a 71 y.o. female is here for an colonoscopy.   Past Medical History:  Diagnosis Date   Arthritis    Barrett esophagus    Hypertension     Past Surgical History:  Procedure Laterality Date   ABDOMINAL HYSTERECTOMY     COLONOSCOPY WITH PROPOFOL N/A 03/12/2019   Procedure: COLONOSCOPY WITH PROPOFOL;  Surgeon: Virgel Manifold, MD;  Location: [facility] ENDOSCOPY;  Service: Endoscopy;  Laterality: N/A;   ESOPHAGOGASTRODUODENOSCOPY (EGD) WITH PROPOFOL N/A 03/12/2019   Procedure: ESOPHAGOGASTRODUODENOSCOPY (EGD) WITH PROPOFOL;  Surgeon: Virgel Manifold, MD;  Location: [facility] ENDOSCOPY;  Service: Endoscopy;  Laterality: N/A;   FOOT SURGERY     HAND SURGERY      Prior to Admission medications   Medication Sig Start Date End Date Taking? Authorizing Provider  atorvastatin (LIPITOR) 10 MG tablet TAKE 1 TABLET(10 MG) BY MOUTH DAILY AT 6 PM 06/06/21  Yes Karamalegos, Devonne Doughty, DO  calcium-vitamin D (OSCAL WITH D) 500-200 MG-UNIT per tablet Take 1 tablet by mouth.   Yes [provider]  escitalopram (LEXAPRO) 10 MG tablet TAKE 1 TABLET(10 MG) BY MOUTH DAILY 06/06/21  Yes Karamalegos, Devonne Doughty, DO  losartan (COZAAR) 50 MG tablet Take 1 tablet (50 mg total) by mouth daily. 06/06/21  Yes Karamalegos, Devonne Doughty, DO  alendronate (FOSAMAX) 70 MG tablet TAKE 1 TABLET(70 MG) BY MOUTH 1 TIME A WEEK WITH A FULL GLASS OF WATER AND ON AN EMPTY STOMACH 06/06/21   Parks Ranger, Devonne Doughty, DO  atorvastatin (LIPITOR) 20 MG tablet     [provider]  baclofen (LIORESAL) 10 MG tablet     [provider]  buPROPion (WELLBUTRIN XL) 150 MG 24 hr tablet     [provider]  Cetirizine HCl 10 MG  CAPS Take 10 mg by mouth at bedtime.  Patient not taking: Reported on 06/01/2022    [provider]  clindamycin (CLEOCIN) 150 MG capsule     [provider]  etodolac (LODINE) 500 MG tablet     [provider]  hydrochlorothiazide (HYDRODIURIL) 12.5 MG tablet     [provider]  HYDROcodone-acetaminophen (NORCO/VICODIN) 5-325 MG tablet TAKE 1 TABLET BY MOUTH EVERY 6 HOURS FOR 5 DAYS AS NEEDED Patient not taking: Reported on 06/01/2022    [provider]  HYDROmorphone (DILAUDID) 2 MG tablet     [provider]  ibuprofen (ADVIL) 600 MG tablet Take 1 tablet by mouth 3 (three) times daily.    [provider]  Influenza Vac A&B Surf Ant Adj 0.5 ML SUSY inject 0.5 milliliter intramuscularly    [provider]  losartan (COZAAR) 100 MG tablet     [provider]  Melatonin 3 MG TABS Take by mouth as needed.    [provider]  meloxicam (MOBIC) 15 MG tablet     [provider]  omeprazole (PRILOSEC) 20 MG capsule TAKE 1 CAPSULE(20 MG) BY MOUTH DAILY 07/24/22   Karamalegos, Alexander J, DO  ondansetron (ZOFRAN-ODT) 4 MG disintegrating tablet DISSOLVE 1 TABLET ON THE TONGUE EVERY 12 HOURS AS NEEDED FOR NAUSEA Patient not taking: Reported on 06/01/2022    [provider]  oxyCODONE (OXY IR/ROXICODONE) 5 MG immediate release tablet TAKE 1  TABLET BY MOUTH EVERY 4 HOURS Patient not taking: Reported on 06/01/2022    [provider]  oxyCODONE-acetaminophen (PERCOCET) 10-325 MG tablet     [provider]  polyethylene glycol (GOLYTELY) 236 g solution At 5:00 pm the evening before procedure You will need to fill to the line indicated on the container with a clear liquid. You will need to mix very well and drink 8 ounces every 15-20 minutes until (1/2) of the liquid in container has been completed. On the day of procedure, 5 hours before the scheduled time You will need to drink the rest of the  liquid as instructed previously, 8 ounces every 15 minutes until finished within 2 hours. 07/18/22   Jonathon Bellows, MD  promethazine (PHENERGAN) 25 MG tablet     [provider]  [facility] injection Inject 0.5 mL into muscle for shingles vaccine. Repeat dose in 2-6 months. 06/06/21   Karamalegos, Devonne Doughty, DO  traMADol Veatrice Bourbon) 50 MG tablet     [provider]    Allergies as of 07/19/2022 - Review Complete 06/01/2022  Allergen Reaction Noted   Penicillins Itching 07/21/2014    Family History  Problem Relation Age of Onset   Breast cancer Mother 5   Breast cancer Maternal Grandmother     Social History   Socioeconomic History   Marital status: Married    Spouse name: Not on file   Number of children: Not on file   Years of education: Not on file   Highest education level: High school graduate  Occupational History   Not on file  Tobacco Use   Smoking status: Every Day    Packs/day: 0.25    Types: Cigarettes   Smokeless tobacco: Current   Tobacco comments:    quit in 2017, restarted.   Vaping Use   Vaping Use: Never used  Substance and Sexual Activity   Alcohol use: Yes    Alcohol/week: 21.0 standard drinks of alcohol    Types: 21 Glasses of wine per week    Comment: 3 glasses wine daily   Drug use: Yes    Types: Marijuana    Comment: occasional    Sexual activity: Not Currently  Other Topics Concern   Not on file  Social History Narrative   Not on file   Social Determinants of Health   Financial Resource Strain: Low Risk  (06/01/2022)   Overall Financial Resource Strain (CARDIA)    Difficulty of Paying Living Expenses: Not hard at all  Food Insecurity: No Food Insecurity (06/01/2022)   Hunger Vital Sign    Worried About Running Out of Food in the Last Year: Never true    Ran Out of Food in the Last Year: Never true  Transportation Needs: No Transportation Needs (06/01/2022)   PRAPARE - Hydrologist (Medical): No     Lack of Transportation (Non-Medical): No  Physical Activity: Insufficiently Active (06/01/2022)   Exercise Vital Sign    Days of Exercise per Week: 3 days    Minutes of Exercise per Session: 30 min  Stress: No Stress Concern Present (06/01/2022)   [place]    Feeling of Stress : Not at all  Social Connections: Moderately Isolated (06/01/2022)   Social Connection and Isolation Panel [NHANES]    Frequency of Communication with Friends and Family: Twice a week    Frequency of Social Gatherings with Friends and Family: Once a week  Attends Religious Services: Never    Active Member of Clubs or Organizations: No    Attends Archivist Meetings: Never    Marital Status: Married  Human resources officer Violence: Not At Risk (06/01/2022)   Humiliation, Afraid, Rape, and Kick questionnaire    Fear of Current or Ex-Partner: No    Emotionally Abused: No    Physically Abused: No    Sexually Abused: No    Review of Systems: See HPI, otherwise negative ROS  Physical Exam: BP 129/67   Pulse 67   Temp 97.9 F (36.6 C) (Temporal)   Resp 16   Ht 5' 3.5" (1.613 m)   Wt 56.7 kg   SpO2 100%   BMI 21.80 kg/m  General:   Alert,  pleasant and cooperative in NAD Head:  Normocephalic and atraumatic. Neck:  Supple; no masses or thyromegaly. Lungs:  Clear throughout to auscultation, normal respiratory effort.    Heart:  +S1, +S2, Regular rate and rhythm, No edema. Abdomen:  Soft, nontender and nondistended. Normal bowel sounds, without guarding, and without rebound.   Neurologic:  Alert and  oriented x4;  grossly normal neurologically.  Impression/Plan: JEANIFER HALLIDAY is here for an colonoscopy to be performed for surveillance due to prior history of colon polyps   Risks, benefits, limitations, and alternatives regarding  colonoscopy have been reviewed with the patient.  Questions have been answered.  All parties  agreeable.   Jonathon Bellows, MD  08/08/2022, 12:34 PM

## 2022-08-08 NOTE — Anesthesia Preprocedure Evaluation (Addendum)
Anesthesia Evaluation  Patient identified by MRN, date of birth, ID band Patient awake    Reviewed: Allergy & Precautions, H&P , NPO status , Patient's Chart, lab work & pertinent test results, reviewed documented beta blocker date and time   History of Anesthesia Complications Negative for: history of anesthetic complications  Airway Mallampati: II  TM Distance: >3 FB Neck ROM: full    Dental  (+) Dental Advidsory Given, Caps, Teeth Intact   Pulmonary neg shortness of breath, neg COPD, neg recent URI, Current Smoker and Patient abstained from smoking.   Pulmonary exam normal        Cardiovascular Exercise Tolerance: Good hypertension, Pt. on medications (-) angina (-) Past MI Normal cardiovascular exam(-) dysrhythmias (-) Valvular Problems/Murmurs     Neuro/Psych negative neurological ROS  negative psych ROS   GI/Hepatic Neg liver ROS,GERD  Controlled,,  Endo/Other  negative endocrine ROS    Renal/GU negative Renal ROS  negative genitourinary   Musculoskeletal   Abdominal Normal abdominal exam  (+)   Peds  Hematology negative hematology ROS (+)   Anesthesia Other Findings Past Medical History: No date: Arthritis No date: Hypertension   Reproductive/Obstetrics negative OB ROS                             Anesthesia Physical Anesthesia Plan  ASA: 2  Anesthesia Plan: General   Post-op Pain Management:    Induction: Intravenous  PONV Risk Score and Plan: 2 and Propofol infusion and TIVA  Airway Management Planned: Natural Airway and Nasal Cannula  Additional Equipment:   Intra-op Plan:   Post-operative Plan:   Informed Consent: I have reviewed the patients History and Physical, chart, labs and discussed the procedure including the risks, benefits and alternatives for the proposed anesthesia with the patient or authorized representative who has indicated his/her understanding  and acceptance.     Dental Advisory Given  Plan Discussed with: Anesthesiologist, CRNA and Surgeon  Anesthesia Plan Comments:         Anesthesia Quick Evaluation

## 2022-08-08 NOTE — Op Note (Signed)
[facility] Gastroenterology Patient Name: Monica Chapman Procedure Date: 08/08/2022 12:35 PM MRN: [id number] Account #: [id number] Date of Birth: 1951-09-24 Admit Type: Outpatient Age: 72 Room: [facility] ENDO ROOM 3 Gender: Female Note Status: Finalized Instrument Name: Colonoscope [id number],Peds Mart [id number] Procedure:             Colonoscopy Indications:           Surveillance: Personal history of adenomatous polyps                         on last colonoscopy > 3 years ago Providers:             Jonathon Bellows MD, MD Referring MD:          Olin Hauser (Referring MD) Medicines:             Monitored Anesthesia Care Complications:         No immediate complications. Procedure:             Pre-Anesthesia Assessment:                        - Prior to the procedure, a History and Physical was                         performed, and patient medications, allergies and                         sensitivities were reviewed. The patient's tolerance                         of previous anesthesia was reviewed.                        - The risks and benefits of the procedure and the                         sedation options and risks were discussed with the                         patient. All questions were answered and informed                         consent was obtained.                        - ASA Grade Assessment: II - A patient with mild                         systemic disease.                        After obtaining informed consent, the colonoscope was                         passed under direct vision. Throughout the procedure,                         the patient's blood pressure, pulse, and oxygen  saturations were monitored continuously. The                         Colonoscope was introduced through the anus and                         advanced to the the cecum, identified by the                         appendiceal orifice. The  Colonoscope was introduced                         through the and advanced to the. The colonoscopy was                         performed with ease. The patient tolerated the                         procedure well. The quality of the bowel preparation                         was excellent. The appendiceal orifice was                         photographed. Findings:      The perianal and digital rectal examinations were normal.      Two pedunculated polyps were found in the sigmoid colon. The polyps were       10 to 15 mm in size. These polyps were removed with a hot snare.       Resection and retrieval were complete. To prevent bleeding after the       polypectomy, three hemostatic clips were successfully placed. There was       no bleeding at the end of the procedure.      Many small-mouthed diverticula were found in the left colon.      Two sessile polyps were found in the sigmoid colon and cecum. The polyps       were 4 to 5 mm in size. These polyps were removed with a cold snare.       Resection and retrieval were complete.      Three sessile polyps were found in the descending colon. The polyps were       5 to 7 mm in size. These polyps were removed with a cold snare.       Resection and retrieval were complete.      Five sessile polyps were found in the ascending colon. The polyps were 5       to 7 mm in size. These polyps were removed with a cold snare. Resection       and retrieval were complete.      The exam was otherwise without abnormality on direct and retroflexion       views. Impression:            - Two 10 to 15 mm polyps in the sigmoid colon, removed                         with a hot snare. Resected and retrieved. Clips were  placed.                        - Diverticulosis in the left colon.                        - Two 4 to 5 mm polyps in the sigmoid colon and in the                         cecum, removed with a cold snare. Resected and                          retrieved.                        - Three 5 to 7 mm polyps in the descending colon,                         removed with a cold snare. Resected and retrieved.                        - Five 5 to 7 mm polyps in the ascending colon,                         removed with a cold snare. Resected and retrieved.                        - The examination was otherwise normal on direct and                         retroflexion views. Recommendation:        - Discharge patient to home (with escort).                        - Resume previous diet.                        - Continue present medications.                        - Await pathology results.                        - Repeat colonoscopy in 1 year for surveillance.                        - Rfer for genetic testing due to > 10 polyps Procedure Code(s):     --- Professional ---                        [phone], Colonoscopy, flexible; with removal of                         tumor(s), polyp(s), or other lesion(s) by snare                         technique Diagnosis Code(s):     --- Professional ---  Z86.010, Personal history of colonic polyps                        D12.5, Benign neoplasm of sigmoid colon                        D12.0, Benign neoplasm of cecum                        D12.4, Benign neoplasm of descending colon                        D12.2, Benign neoplasm of ascending colon                        K57.30, Diverticulosis of large intestine without                         perforation or abscess without bleeding CPT copyright 2022 American Medical Association. All rights reserved. The codes documented in this report are preliminary and upon coder review may  be revised to meet current compliance requirements. Jonathon Bellows, MD Jonathon Bellows MD, MD 08/08/2022 1:26:37 PM This report has been signed electronically. Number of Addenda: 0 Note Initiated On: 08/08/2022 12:35 PM Scope Withdrawal Time: 0 hours 29  minutes 25 seconds  Total Procedure Duration: 0 hours 33 minutes 18 seconds  Estimated Blood Loss:  Estimated blood loss: none.      [facility]

## 2022-08-08 NOTE — Transfer of Care (Signed)
Immediate Anesthesia Transfer of Care Note  Patient: CORRENE LALANI  Procedure(s) Performed: COLONOSCOPY WITH PROPOFOL  Patient Location: PACU  Anesthesia Type:General  Level of Consciousness: sedated  Airway & Oxygen Therapy: Patient Spontanous Breathing  Post-op Assessment: Report given to RN and Post -op Vital signs reviewed and stable  Post vital signs: Reviewed and stable  Last Vitals:  Vitals Value Taken Time  BP 92/59 08/08/22 1328  Temp    Pulse 63 08/08/22 1329  Resp 13 08/08/22 1329  SpO2 100 % 08/08/22 1329  Vitals shown include unvalidated device data.  Last Pain:  Vitals:   08/08/22 1154  TempSrc: Temporal  PainSc: 0-No pain         Complications: No notable events documented.

## 2022-08-09 ENCOUNTER — Encounter: Payer: Self-pay | Admitting: Gastroenterology

## 2022-08-09 LAB — SURGICAL PATHOLOGY

## 2022-08-09 NOTE — Anesthesia Postprocedure Evaluation (Signed)
Anesthesia Post Note  Patient: Monica Chapman  Procedure(s) Performed: COLONOSCOPY WITH PROPOFOL  Patient location during evaluation: PACU Anesthesia Type: General Level of consciousness: awake and alert Pain management: pain level controlled Vital Signs Assessment: post-procedure vital signs reviewed and stable Respiratory status: spontaneous breathing, nonlabored ventilation and respiratory function stable Cardiovascular status: blood pressure returned to baseline and stable Postop Assessment: no apparent nausea or vomiting Anesthetic complications: no   No notable events documented.   Last Vitals:  Vitals:   08/08/22 1338 08/08/22 1348  BP: 111/73 121/68  Pulse: 62 (!) 59  Resp: 20 18  Temp:    SpO2: 99% 100%    Last Pain:  Vitals:   08/09/22 0726  TempSrc:   PainSc: 0-No pain                 [place]

## 2022-09-03 IMAGING — CT CT ELBOW*R* W/O CM
4 of 10 series · 9 of 33 positions shown, 10 images · non-contrast
Comparison: None.

CLINICAL DATA: Right elbow pain with swelling

EXAM:
CT OF THE UPPER RIGHT EXTREMITY WITHOUT CONTRAST
TECHNIQUE: Multidetector CT imaging of the upper right extremity was performed
according to the standard protocol.
RADIATION DOSE REDUCTION: This exam was performed according to the
departmental dose-optimization program which includes automated
exposure control, adjustment of the mA and/or kV according to
patient size and/or use of iterative reconstruction technique.

[Series 4: humerus axial bone elbow hd fov 1.50 ax · axial · 0.29mm/px · z∈[-1090,-1038]mm · 2 of 216 slices shown, 3 images]
[im 72/216  soft-tissue]
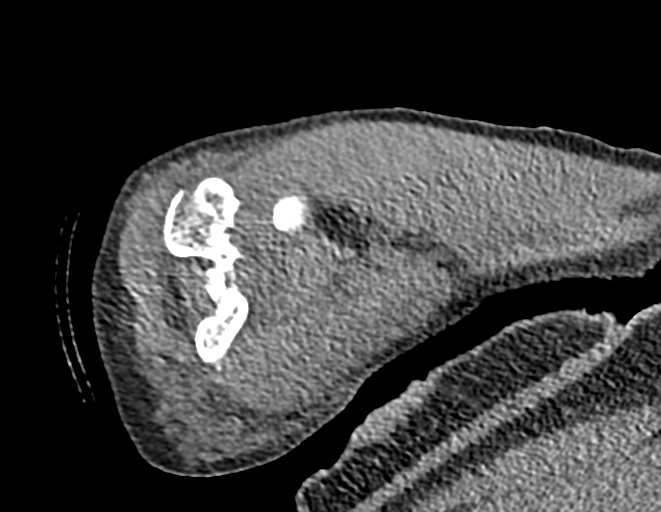
[im 72/216  bone]
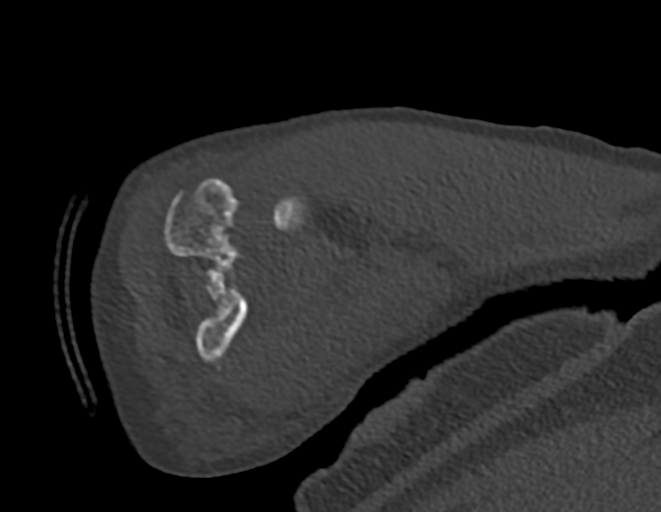
[im 144/216  bone]
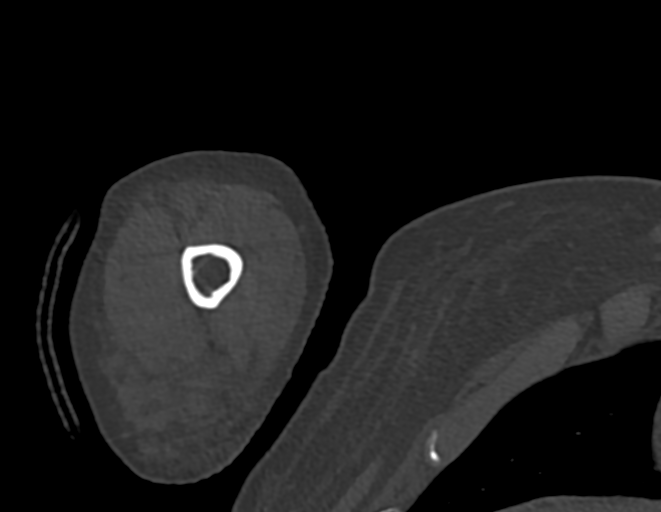

[Series 6: humerus axial st elbow hd fov 1.50 ax · axial · 0.29mm/px · z∈[-1090,-1038]mm · 2 of 216 slices shown]
[im 72/216  bone]
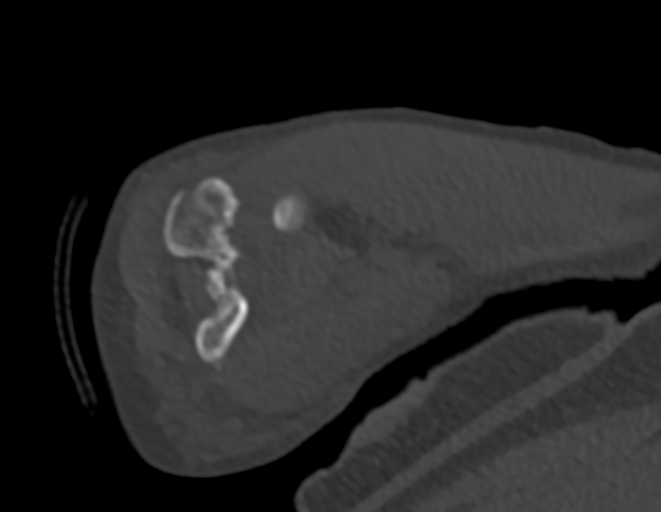
[im 144/216  bone]
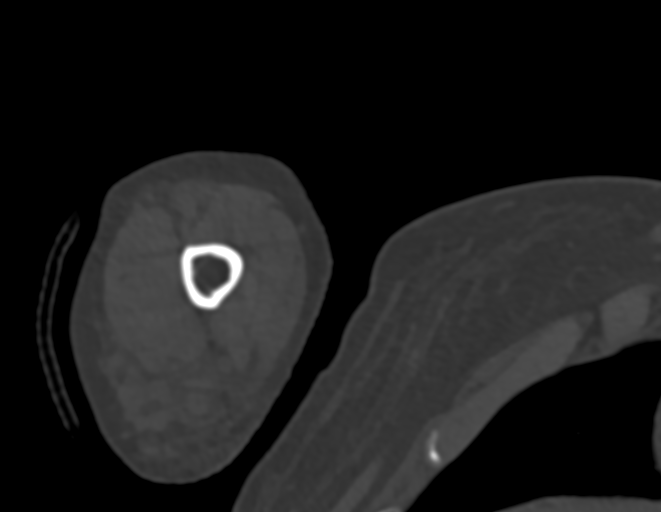

[Series 12: sag bone elbow hd fov 1.50 cor · coronal · 0.33mm/px · 3 of 186 slices shown]
[im 16/186  bone]
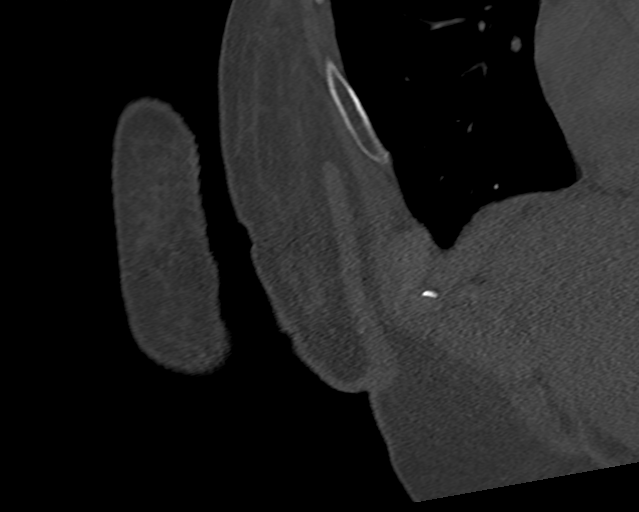
[im 93/186  bone]
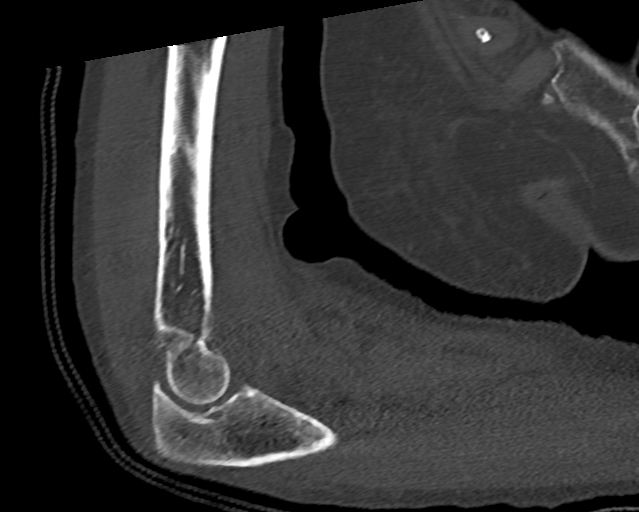
[im 171/186  bone]
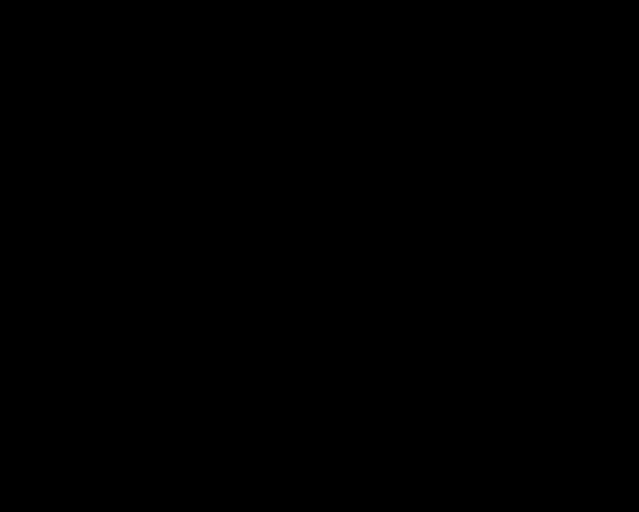

[Series 18: _radius ulnaaxial bone elbow hd fov 1.50 sag · sagittal · 0.24mm/px · 2 of 216 slices shown]
[im 72/216  bone]
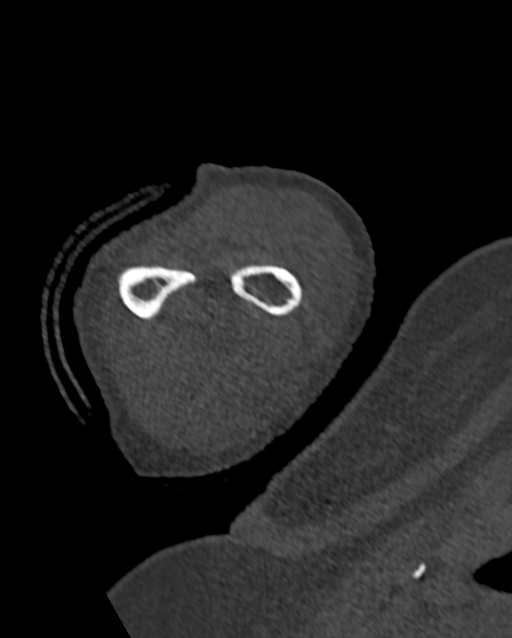
[im 144/216  bone]
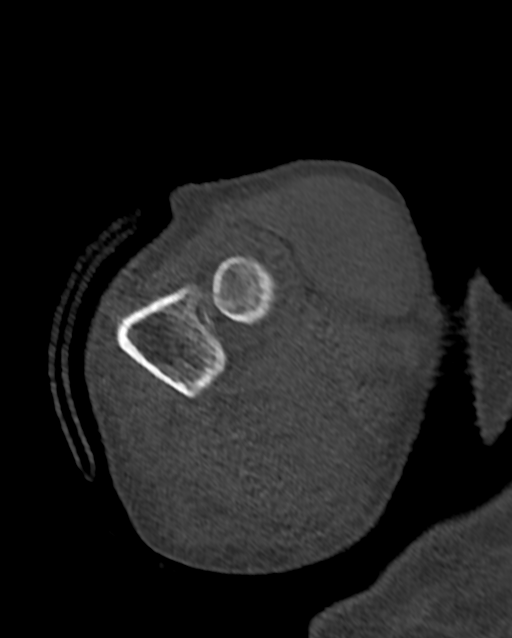

[9 of 33 positions shown; findings below may reference images not displayed]

FINDINGS: Bones/Joint/Cartilage

There is a transverse supracondylar humerus fracture with
longitudinal component extending to the articular surface of the
trochlea. There is mild displacement with rotation of the distal
fracture fragment, posterior offset by 5 mm on the ulnar aspect and
anterior offset by 5 mm on the radial aspect. There is an osteophyte
of the olecranon posteriorly which appears slightly detached but
well corticated. There is no evidence of a concomitant radial head
or ulna fracture. There is a moderate-sized joint
effusion/hemarthrosis.

Ligaments

Suboptimally assessed by CT.

Muscles and Tendons

No significant muscle atrophy. No intramuscular collection. No acute
myotendinous abnormality by CT.

Soft tissues

There is adjacent soft tissue swelling.
IMPRESSION: Transverse supracondylar humerus fracture with longitudinal
component extending to the articular surface of the trochlea. Mild
displacement with rotation of the distal fracture fragment,
posterior offset by 5 mm on the ulnar aspect and anterior offset by
5 mm on the radial aspect. Moderate-sized hemarthrosis.

## 2022-09-25 ENCOUNTER — Other Ambulatory Visit: Payer: Self-pay | Admitting: Family Medicine

## 2022-09-25 DIAGNOSIS — M810 Age-related osteoporosis without current pathological fracture: Secondary | ICD-10-CM

## 2022-09-26 NOTE — Telephone Encounter (Signed)
Requested medication (s) are due for refill today:routing for approval  Requested medication (s) are on the active medication list:yes  Last refill:  06/06/22  Future visit scheduled: no  Notes to clinic:  Unable to refill per protocol due to failed labs, no updated results.      Requested Prescriptions  Pending Prescriptions Disp Refills   alendronate (FOSAMAX) 70 MG tablet [Pharmacy Med Name: ALENDRONATE 70MG TABLETS] 12 tablet 3    Sig: TAKE 1 TABLET(70 MG) BY MOUTH 1 TIME A WEEK WITH A FULL GLASS OF WATER AND ON AN EMPTY STOMACH     Endocrinology:  Bisphosphonates Failed - 09/25/2022 10:08 AM      Failed - Ca in normal range and within 360 days    Calcium  Date Value Ref Range Status  06/13/2021 9.9 8.7 - 10.3 mg/dL Final         Failed - Vitamin D in normal range and within 360 days    Vit D, 25-Hydroxy  Date Value Ref Range Status  02/23/2020 78.1 30.0 - 100.0 ng/mL Final    Comment:    Vitamin D deficiency has been defined by the Institute of Medicine and an Endocrine Society practice guideline as a level of serum 25-OH vitamin D less than 20 ng/mL (1,2). The Endocrine Society went on to further define vitamin D insufficiency as a level between 21 and 29 ng/mL (2). 1. IOM (Institute of Medicine). 2010. Dietary reference    intakes for calcium and D. [place]: The    [employer]. 2. Holick MF, Binkley [place], Bischoff-Ferrari HA, et al.    Evaluation, treatment, and prevention of vitamin D    deficiency: an Endocrine Society clinical practice    guideline. JCEM. 2011 Jul; 96(7):1911-30.          Failed - Cr in normal range and within 360 days    Creat  Date Value Ref Range Status  09/04/2017 0.86 0.50 - 0.99 mg/dL Final    Comment:    For patients >33 years of age, the reference limit for Creatinine is approximately 13% higher for people identified as African-American. .    Creatinine, Ser  Date Value Ref Range Status  06/13/2021 0.57 0.57 -  1.00 mg/dL Final         Failed - Mg Level in normal range and within 360 days    Magnesium  Date Value Ref Range Status  11/07/2018 1.9 1.6 - 2.3 mg/dL Final         Failed - Phosphate in normal range and within 360 days    No results found for: "PHOS"       Failed - eGFR is 30 or above and within 360 days    GFR, Est African American  Date Value Ref Range Status  09/04/2017 83 > OR = 60 mL/min/1.56m Final   GFR calc Af Amer  Date Value Ref Range Status  05/25/2020 104 >59 mL/min/1.73 Final    Comment:    **[facility] currently reports eGFR in compliance with the current**   recommendations of the [employer] [facility] will   update reporting as new guidelines are published from the NKF-ASN   Task force.    GFR, Est Non African American  Date Value Ref Range Status  09/04/2017 71 > OR = 60 mL/min/1.760mFinal   GFR calc non Af Amer  Date Value Ref Range Status  05/25/2020 90 >59 mL/min/1.73 Final   eGFR  Date Value Ref Range Status  06/13/2021 99 >59 mL/min/1.73 Final         Passed - Valid encounter within last 12 months    Recent Outpatient Visits           1 year ago Essential hypertension   [place], Devonne Doughty, DO   2 years ago Essential hypertension   [place], Devonne Doughty, DO   2 years ago Essential hypertension   [place], Devonne Doughty, DO   3 years ago Unexplained weight loss   [place], DO   3 years ago Poor appetite   [place], DO              Passed - Bone Mineral Density or Dexa Scan completed in the last 2 years

## 2022-09-26 NOTE — Telephone Encounter (Signed)
Sent to me in error. 

## 2022-10-18 DIAGNOSIS — H905 Unspecified sensorineural hearing loss: Secondary | ICD-10-CM | POA: Diagnosis not present

## 2022-10-18 DIAGNOSIS — H9312 Tinnitus, left ear: Secondary | ICD-10-CM | POA: Diagnosis not present

## 2022-10-30 ENCOUNTER — Other Ambulatory Visit: Payer: Self-pay | Admitting: Family Medicine

## 2022-10-30 DIAGNOSIS — K2950 Unspecified chronic gastritis without bleeding: Secondary | ICD-10-CM

## 2022-10-31 NOTE — Telephone Encounter (Signed)
Courtesy refill has already been provided- needs appointment Requested Prescriptions  Pending Prescriptions Disp Refills   omeprazole (PRILOSEC) 20 MG capsule [Pharmacy Med Name: OMEPRAZOLE '20MG'$  CAPSULES] 90 capsule 0    Sig: TAKE 1 CAPSULE(20 MG) BY MOUTH DAILY     Gastroenterology: Proton Pump Inhibitors Passed - 10/30/2022 12:58 PM      Passed - Valid encounter within last 12 months    Recent Outpatient Visits           1 year ago Essential hypertension   [place], Devonne Doughty, DO   2 years ago Essential hypertension   [place], [place], DO   2 years ago Essential hypertension   [place], DO   3 years ago Unexplained weight loss   [place], DO   3 years ago Poor appetite   [place], [place]

## 2022-11-01 ENCOUNTER — Ambulatory Visit (INDEPENDENT_AMBULATORY_CARE_PROVIDER_SITE_OTHER): Payer: Medicare Other | Admitting: Family Medicine

## 2022-11-01 ENCOUNTER — Encounter: Payer: Self-pay | Admitting: Family Medicine

## 2022-11-01 VITALS — BP 136/74 | HR 72 | Ht 63.5 in | Wt 127.6 lb

## 2022-11-01 DIAGNOSIS — K2950 Unspecified chronic gastritis without bleeding: Secondary | ICD-10-CM

## 2022-11-01 DIAGNOSIS — E782 Mixed hyperlipidemia: Secondary | ICD-10-CM | POA: Diagnosis not present

## 2022-11-01 DIAGNOSIS — I10 Essential (primary) hypertension: Secondary | ICD-10-CM | POA: Diagnosis not present

## 2022-11-01 DIAGNOSIS — R7309 Other abnormal glucose: Secondary | ICD-10-CM

## 2022-11-01 DIAGNOSIS — F5101 Primary insomnia: Secondary | ICD-10-CM | POA: Diagnosis not present

## 2022-11-01 DIAGNOSIS — E559 Vitamin D deficiency, unspecified: Secondary | ICD-10-CM

## 2022-11-01 MED ORDER — OMEPRAZOLE 20 MG PO CPDR: 20.0000 mg | DELAYED_RELEASE_CAPSULE | Freq: Every day | ORAL | 3 refills | Status: DC

## 2022-11-01 MED ORDER — ATORVASTATIN CALCIUM 10 MG PO TABS: 10.0000 mg | ORAL_TABLET | Freq: Every day | ORAL | 3 refills | Status: DC

## 2022-11-01 NOTE — Progress Notes (Signed)
Subjective:    Patient ID: Monica Chapman, female    DOB: [birth year], 72 y.o.   MRN: [id number]  Monica Chapman is a 72 y.o. female presenting on 11/01/2022 for Dizziness and Gastroesophageal Reflux   HPI  Follow up  Last visit 05/2021  Recently had issue with dizziness the other day, difficulty with balance Admits nausea associated. No vomiting Has improved now, only mild. Provoked with standing up, bending forward  History of weight loss  Osteoporosis  Needs all meds refilled   Hypertension Weight loss stabilized  Due for labs She is doing well. Now off medication  Appetite is good, eats mostly meat and plenty of vegetables, increasing pasta and starches  History Alcohol consumption / Elevated LFT Due for labs Improved alcohol intake   Hyperlipidemia Needs refill and due for lab   Insomnia Improved Off Escitalopram '10mg'$  Takes Melatonin '10mg'$   GERD  Omeprazole almost ran out of '20mg'$        11/01/2022   10:06 AM 06/01/2022    2:06 PM 06/06/2021    2:52 PM  Depression screen PHQ 2/9  Decreased Interest 0 0 0  Down, Depressed, Hopeless 0 0 0  PHQ - 2 Score 0 0 0  Altered sleeping 0 0 0  Tired, decreased energy 0 0 0  Change in appetite 0 0 0  Feeling bad or failure about yourself  0 0 0  Trouble concentrating 0 0 0  Moving slowly or fidgety/restless 0 0 0  Suicidal thoughts 0 0 0  PHQ-9 Score 0 0 0  Difficult doing work/chores Not difficult at all Not difficult at all Not difficult at all    Social History   Tobacco Use   Smoking status: Every Day    Packs/day: 0.25    Types: Cigarettes   Smokeless tobacco: Current   Tobacco comments:    quit in 2017, restarted.   Vaping Use   Vaping Use: Never used  Substance Use Topics   Alcohol use: Yes    Alcohol/week: 21.0 standard drinks of alcohol    Types: 21 Glasses of wine per week    Comment: 3 glasses wine daily   Drug use: Yes    Types: Marijuana    Comment: occasional     Review of  Systems Per HPI unless specifically indicated above     Objective:    BP 136/74   Pulse 72   Ht 5' 3.5" (1.613 m)   Wt 127 lb 9.6 oz (57.9 kg)   SpO2 100%   BMI 22.25 kg/m   Wt Readings from Last 3 Encounters:  11/01/22 127 lb 9.6 oz (57.9 kg)  08/08/22 125 lb (56.7 kg)  06/01/22 125 lb 12.8 oz (57.1 kg)    Physical Exam Vitals and nursing note reviewed.  Constitutional:      General: She is not in acute distress.    Appearance: She is well-developed. She is not diaphoretic.     Comments: Well-appearing, comfortable, cooperative  HENT:     Head: Normocephalic and atraumatic.  Eyes:     General:        Right eye: No discharge.        Left eye: No discharge.     Conjunctiva/sclera: Conjunctivae normal.  Neck:     Thyroid: No thyromegaly.  Cardiovascular:     Rate and Rhythm: Normal rate and regular rhythm.     Heart sounds: Normal heart sounds. No murmur heard. Pulmonary:     Effort: Pulmonary  effort is normal. No respiratory distress.     Breath sounds: Normal breath sounds. No wheezing or rales.  Musculoskeletal:        General: Normal range of motion.     Cervical back: Normal range of motion and neck supple.  Lymphadenopathy:     Cervical: No cervical adenopathy.  Skin:    General: Skin is warm and dry.     Findings: No erythema or rash.  Neurological:     Mental Status: She is alert and oriented to person, place, and time.  Psychiatric:        Behavior: Behavior normal.     Comments: Well groomed, good eye contact, normal speech and thoughts     Results for orders placed or performed during the hospital encounter of 08/08/22  Surgical pathology  Result Value Ref Range   SURGICAL PATHOLOGY      SURGICAL PATHOLOGY CASE: [id number] PATIENT: [name] Surgical Pathology Report     Specimen Submitted: A. Colon polyp x6, cecum(1) asc(5); c snare B. Colon polyp x3, descending; cold snare C. Colon polyp x3, sig; c(1) h(2) snare  Clinical  History: Personal history of colon polyps    DIAGNOSIS: A. COLON POLYPS X 6, CECUM (X 1) AND ASCENDING (X 5); COLD SNARE: - MULTIPLE FRAGMENTS OF TUBULAR ADENOMAS. - NEGATIVE FOR HIGH-GRADE DYSPLASIA AND MALIGNANCY.  B. COLON POLYPS X 3, DESCENDING; COLD SNARE: - FRAGMENTS (X 5) OF TUBULAR ADENOMAS. - NEGATIVE FOR HIGH-GRADE DYSPLASIA AND MALIGNANCY.  C. COLON POLYPS X 3, SIGMOID; HOT (X 2) AND COLD (X 1) SNARES: - SINGLE FRAGMENT OF TUBULAR ADENOMA WITH MUCOSAL PROLAPSE TYPE CHANGES. - SINGLE MUCOSAL PROLAPSE TYPE POLYP WITH HYPERPLASTIC CHANGES; NEGATIVE FOR DYSPLASIA. - FRAGMENTS (X 2) OF BENIGN COLONIC MUCOSA WITH SUPERFICIAL REACTIVE CHANGES. - CAUTERIZED BASE OF THE LARGEST 2 POLYPS FRAGMENTS FREE OF DYS PLASIA. - NEGATIVE FOR HIGH-GRADE DYSPLASIA AND MALIGNANCY.  GROSS DESCRIPTION: A. Labeled: Cecal polyp x 1 ascending polyp x 5 cold snare Received: Formalin Collection time: 12:35 PM on 08/08/2022 Placed into formalin time: 12:35 PM 08/08/2022 Tissue fragment(s): Multiple Size: Aggregate, 2.2 x 0.7 x 0.2 cm Description: Received are fragments of tan soft tissue admixed with intestinal debris.  The ratio of soft tissue to intestinal debris is 95: 5. Entirely submitted in 1 cassette.  B. Labeled: Descending colon polyp x 3 cold snare Received: Formalin Collection time: 1:07 PM on 08/08/2022 Placed into formalin time: 1:07 PM on 08/08/2022 Tissue fragment(s): Multiple Size: Aggregate, 1.5 x 0.9 x 0.2 cm Description: Received are fragments of tan-pink elongated soft tissue admixed with intestinal debris.  The ratio of soft tissue to intestinal debris is 90: 10. Entirely submitted in 1 cassette.  C. Labeled: Sigmoid colon polyp cold snare x 1, hot snare x 2 Recei ved: Formalin Collection time: 1:10 PM on 08/08/2022 Placed into formalin time: 1:10 PM on 08/08/2022 Tissue fragment(s): Multiple Size: Aggregate, 1.8 x 1.2 x 0.8 cm Description: Received are 3 fragments  of tan-pink soft tissue admixed with intestinal debris.  The ratio of soft tissue to intestinal debris is 95: 5.  The 2 larger soft tissue fragments have resection margins which are differentially inked.  These fragments are sectioned. Entirely submitted in cassettes 1-3 with 1 trisected fragment in cassette 1, 1 serially sectioned fragment in cassette 2, and the remaining fragments in cassette 3.  RB 08/08/2022  Final Diagnosis performed by Allena Napoleon, MD.   Electronically signed 08/09/2022 11:37:11AM The electronic signature indicates that the named Attending  Pathologist has evaluated the specimen Technical component performed at [facility], [address] [place], [place] [address] Lab: [id number] Dir: Rush Farmer, MD, MMM  Professional component performed at [facility], [facility], [place], [place], [place] [address] Lab: [phone] Dir: Kathi Simpers, MD       Assessment & Plan:   Problem List Items Addressed This Visit     Essential hypertension - Primary   Relevant Medications   atorvastatin (LIPITOR) 10 MG tablet   Gastritis   Relevant Medications   omeprazole (PRILOSEC) 20 MG capsule   Hyperlipidemia   Relevant Medications   atorvastatin (LIPITOR) 10 MG tablet   Insomnia    Seems to be postural symptoms with standing bending position change Less hydrated based on history Caution with HYPERTENSION meds  Keep / Start taking Atorvastatin '10mg'$  nightly for cholesterol Omeprazole '20mg'$  daily before breakfast for stomach acid Alendronate weekly for bone density  Stop taking Hydrochlorothiazide 12.'5mg'$  - for blood pressure Losartan '50mg'$  daily - for blood pressure  Try to increase flulids, may take some sodium salt extra to help maintain  Caution with sudden standing / turning  Not suggestive entirely of Vertigo. But possible. Home Epley maneuver given May try these exercises if you keep having dizzy spells.   Meds ordered this  encounter  Medications   atorvastatin (LIPITOR) 10 MG tablet    Sig: Take 1 tablet (10 mg total) by mouth at bedtime.    Dispense:  90 tablet    Refill:  3   omeprazole (PRILOSEC) 20 MG capsule    Sig: Take 1 capsule (20 mg total) by mouth daily before breakfast.    Dispense:  90 capsule    Refill:  3     Follow up plan: Return in about 1 day (around 11/02/2022) for return 1 day, Fri 2/9 for fasting lab only.  Fasting labs ordered to [place], DO [facility] 11/01/2022, 10:24 AM

## 2022-11-01 NOTE — Patient Instructions (Addendum)
Thank you for coming to the office today.  Keep / Start taking Atorvastatin '10mg'$  nightly for cholesterol Omeprazole '20mg'$  daily before breakfast for stomach acid Alendronate weekly for bone density  Stop taking Hydrochlorothiazide 12.'5mg'$  - for blood pressure Losartan '50mg'$  daily - for blood pressure  Try to increase flulids, may take some sodium salt extra to help maintain  Caution with sudden standing / turning  May try these exercises if you keep having dizzy spells.   See the next page for images describing the Epley Manuever.     ----------------------------------------------------------------------------------------------------------------------        DUE for FASTING BLOOD WORK (no food or drink after midnight before the lab appointment, only water or coffee without cream/sugar on the morning of)  SCHEDULE "Lab Only" visit in the morning at the clinic for lab draw in 1 day  - Make sure Lab Only appointment is at about 1 week before your next appointment, so that results will be available  For Lab Results, once available within 2-3 days of blood draw, you can can log in to MyChart online to view your results and a brief explanation. Also, we can discuss results at next follow-up visit.   Please schedule a Follow-up Appointment to: Return in about 1 day (around 11/02/2022) for return 1 day, Fri 2/9 for fasting lab only.  If you have any other questions or concerns, please feel free to call the office or send a message through [facility]. You may also schedule an earlier appointment if necessary.  Additionally, you may be receiving a survey about your experience at our office within a few days to 1 week by e-mail or mail. We value your feedback.  Nobie Putnam, DO [facility]

## 2022-11-02 DIAGNOSIS — E782 Mixed hyperlipidemia: Secondary | ICD-10-CM | POA: Diagnosis not present

## 2022-11-02 DIAGNOSIS — E559 Vitamin D deficiency, unspecified: Secondary | ICD-10-CM | POA: Diagnosis not present

## 2022-11-02 DIAGNOSIS — I10 Essential (primary) hypertension: Secondary | ICD-10-CM | POA: Diagnosis not present

## 2022-11-02 DIAGNOSIS — R7309 Other abnormal glucose: Secondary | ICD-10-CM | POA: Diagnosis not present

## 2022-11-03 LAB — CBC WITH DIFFERENTIAL/PLATELET

## 2022-11-06 LAB — CBC WITH DIFFERENTIAL/PLATELET
Basophils Absolute: 0.1 10*3/uL (ref 0.0–0.2)
Basos: 1 %
EOS (ABSOLUTE): 0.2 10*3/uL (ref 0.0–0.4)
Eos: 3 %
Hematocrit: 39.6 % (ref 34.0–46.6)
Hemoglobin: 14.5 g/dL (ref 11.1–15.9)
Immature Grans (Abs): 0 10*3/uL (ref 0.0–0.1)
Immature Granulocytes: 0 %
Lymphocytes Absolute: 1.6 10*3/uL (ref 0.7–3.1)
Lymphs: 23 %
MCH: 37.1 pg — ABNORMAL HIGH (ref 26.6–33.0)
MCHC: 36.6 g/dL — ABNORMAL HIGH (ref 31.5–35.7)
MCV: 101 fL — ABNORMAL HIGH (ref 79–97)
Monocytes Absolute: 0.5 10*3/uL (ref 0.1–0.9)
Monocytes: 8 %
Neutrophils Absolute: 4.6 10*3/uL (ref 1.4–7.0)
Neutrophils: 65 %
Platelets: 264 10*3/uL (ref 150–450)
RBC: 3.91 x10E6/uL (ref 3.77–5.28)
RDW: 11.2 % — ABNORMAL LOW (ref 11.7–15.4)
WBC: 7 10*3/uL (ref 3.4–10.8)

## 2022-11-06 LAB — HEMOGLOBIN A1C
Est. average glucose Bld gHb Est-mCnc: 105 mg/dL
Hgb A1c MFr Bld: 5.3 % (ref 4.8–5.6)

## 2022-11-06 LAB — COMPREHENSIVE METABOLIC PANEL
ALT: 66 IU/L — ABNORMAL HIGH (ref 0–32)
AST: 131 IU/L — ABNORMAL HIGH (ref 0–40)
Albumin/Globulin Ratio: 1.7 (ref 1.2–2.2)
Albumin: 4.9 g/dL (ref 3.9–4.9)
Alkaline Phosphatase: 99 IU/L (ref 44–121)
BUN/Creatinine Ratio: 10 — ABNORMAL LOW (ref 12–28)
BUN: 6 mg/dL — ABNORMAL LOW (ref 8–27)
Bilirubin Total: 0.9 mg/dL (ref 0.0–1.2)
CO2: 21 mmol/L (ref 20–29)
Calcium: 10 mg/dL (ref 8.7–10.3)
Chloride: 93 mmol/L — ABNORMAL LOW (ref 96–106)
Creatinine, Ser: 0.61 mg/dL (ref 0.57–1.00)
Globulin, Total: 2.9 g/dL (ref 1.5–4.5)
Glucose: 92 mg/dL (ref 70–99)
Potassium: 4.8 mmol/L (ref 3.5–5.2)
Sodium: 135 mmol/L (ref 134–144)
Total Protein: 7.8 g/dL (ref 6.0–8.5)
eGFR: 97 mL/min/{1.73_m2} (ref >59)

## 2022-11-06 LAB — LIPID PANEL
Chol/HDL Ratio: 3.3 ratio (ref 0.0–4.4)
Cholesterol, Total: 263 mg/dL — ABNORMAL HIGH (ref 100–199)
HDL: 80 mg/dL (ref >39)
LDL Chol Calc (NIH): 160 mg/dL — ABNORMAL HIGH (ref 0–99)
Triglycerides: 130 mg/dL (ref 0–149)
VLDL Cholesterol Cal: 23 mg/dL (ref 5–40)

## 2022-11-06 LAB — VITAMIN D 25 HYDROXY (VIT D DEFICIENCY, FRACTURES): Vit D, 25-Hydroxy: 10.8 ng/mL — ABNORMAL LOW (ref 30.0–100.0)

## 2022-11-06 LAB — TSH: TSH: 2 u[IU]/mL (ref 0.450–4.500)

## 2022-11-19 ENCOUNTER — Other Ambulatory Visit: Payer: Self-pay | Admitting: Family Medicine

## 2022-11-19 DIAGNOSIS — F5101 Primary insomnia: Secondary | ICD-10-CM

## 2022-11-20 NOTE — Telephone Encounter (Signed)
No longer current on medication list Requested Prescriptions  Pending Prescriptions Disp Refills   escitalopram (LEXAPRO) 10 MG tablet [Pharmacy Med Name: ESCITALOPRAM '10MG'$  TABLETS] 90 tablet 3    Sig: TAKE 1 TABLET(10 MG) BY MOUTH DAILY     Psychiatry:  Antidepressants - SSRI Passed - 11/19/2022 10:48 AM      Passed - Valid encounter within last 6 months    Recent Outpatient Visits           2 weeks ago Essential hypertension   [place], Devonne Doughty, DO   1 year ago Essential hypertension   [place], Ackworth, DO   2 years ago Essential hypertension   [place], [place], DO   2 years ago Essential hypertension   [place], DO   3 years ago Unexplained weight loss   [place], [place]

## 2023-06-06 ENCOUNTER — Ambulatory Visit (INDEPENDENT_AMBULATORY_CARE_PROVIDER_SITE_OTHER): Payer: Medicare Other

## 2023-06-06 DIAGNOSIS — Z780 Asymptomatic menopausal state: Secondary | ICD-10-CM

## 2023-06-06 DIAGNOSIS — Z0000 Encounter for general adult medical examination without abnormal findings: Secondary | ICD-10-CM | POA: Diagnosis not present

## 2023-06-06 DIAGNOSIS — Z1231 Encounter for screening mammogram for malignant neoplasm of breast: Secondary | ICD-10-CM

## 2023-06-06 NOTE — Progress Notes (Signed)
Subjective:   Monica Chapman is a 72 y.o. female who presents for Medicare Annual (Subsequent) preventive examination.  Visit Complete: Virtual  I connected with  Monica Chapman on 06/06/23 by a audio enabled telemedicine application and verified that I am speaking with the correct person using two identifiers.  Patient Location: Home  Provider Location: Office/Clinic  I discussed the limitations of evaluation and management by telemedicine. The patient expressed understanding and agreed to proceed.  Vital Signs: Unable to obtain new vitals due to this being a telehealth visit.  Review of Systems     Cardiac Risk Factors include: advanced age (>74men, >58 women);dyslipidemia;hypertension;smoking/ tobacco exposure     Objective:    There were no vitals filed for this visit. There is no height or weight on file to calculate BMI.     06/06/2023    3:35 PM 08/08/2022   11:53 AM 06/01/2022    2:08 PM 05/30/2021    2:41 PM 08/11/2019   11:50 AM 03/12/2019   12:21 PM  Advanced Directives  Does Patient Have a Medical Advance Directive? No No No No Yes No  Type of Advance Directive     Living will;Healthcare Power of Attorney   Copy of Healthcare Power of Attorney in Chart?     No - copy requested   Would patient like information on creating a medical advance directive? No - Patient declined  No - Patient declined   No - Patient declined    Current Medications (verified) Outpatient Encounter Medications as of 06/06/2023  Medication Sig   alendronate (FOSAMAX) 70 MG tablet TAKE 1 TABLET(70 MG) BY MOUTH 1 TIME A WEEK WITH A FULL GLASS OF WATER AND ON AN EMPTY STOMACH   atorvastatin (LIPITOR) 10 MG tablet Take 1 tablet (10 mg total) by mouth at bedtime.   calcium-vitamin D (OSCAL WITH D) 500-200 MG-UNIT per tablet Take 1 tablet by mouth.   Cetirizine HCl 10 MG CAPS Take 10 mg by mouth at bedtime.   ibuprofen (ADVIL) 600 MG tablet Take 1 tablet by mouth 3 (three) times daily.    Melatonin 3 MG TABS Take by mouth as needed.   omeprazole (PRILOSEC) 20 MG capsule Take 1 capsule (20 mg total) by mouth daily before breakfast.   No facility-administered encounter medications on file as of 06/06/2023.    Allergies (verified) Penicillins   History: Past Medical History:  Diagnosis Date   Arthritis    Barrett esophagus    Hypertension    Past Surgical History:  Procedure Laterality Date   ABDOMINAL HYSTERECTOMY     COLONOSCOPY WITH PROPOFOL N/A 03/12/2019   Procedure: COLONOSCOPY WITH PROPOFOL;  Surgeon: Pasty Spillers, MD;  Location: [facility] ENDOSCOPY;  Service: Endoscopy;  Laterality: N/A;   COLONOSCOPY WITH PROPOFOL N/A 08/08/2022   Procedure: COLONOSCOPY WITH PROPOFOL;  Surgeon: Wyline Mood, MD;  Location: [facility] ENDOSCOPY;  Service: Gastroenterology;  Laterality: N/A;   ESOPHAGOGASTRODUODENOSCOPY (EGD) WITH PROPOFOL N/A 03/12/2019   Procedure: ESOPHAGOGASTRODUODENOSCOPY (EGD) WITH PROPOFOL;  Surgeon: Pasty Spillers, MD;  Location: [facility] ENDOSCOPY;  Service: Endoscopy;  Laterality: N/A;   FOOT SURGERY     HAND SURGERY     Family History  Problem Relation Age of Onset   Breast cancer Mother 11   Breast cancer Maternal Grandmother    Social History   Socioeconomic History   Marital status: Married    Spouse name: Not on file   Number of children: Not on file   Years of  education: Not on file   Highest education level: High school graduate  Occupational History   Not on file  Tobacco Use   Smoking status: Every Day    Current packs/day: 0.25    Types: Cigarettes   Smokeless tobacco: Current   Tobacco comments:    quit in 2017, restarted.   Vaping Use   Vaping status: Never Used  Substance and Sexual Activity   Alcohol use: Yes    Alcohol/week: 21.0 standard drinks of alcohol    Types: 21 Glasses of wine per week    Comment: 3 glasses wine daily   Drug use: Yes    Types: Marijuana    Comment: occasional    Sexual activity: Not Currently   Other Topics Concern   Not on file  Social History Narrative   Not on file   Social Determinants of Health   Financial Resource Strain: Low Risk  (06/06/2023)   Overall Financial Resource Strain (CARDIA)    Difficulty of Paying Living Expenses: Not hard at all  Food Insecurity: No Food Insecurity (06/06/2023)   Hunger Vital Sign    Worried About Running Out of Food in the Last Year: Never true    Ran Out of Food in the Last Year: Never true  Transportation Needs: No Transportation Needs (06/06/2023)   PRAPARE - Administrator, Civil Service (Medical): No    Lack of Transportation (Non-Medical): No  Physical Activity: Insufficiently Active (06/06/2023)   Exercise Vital Sign    Days of Exercise per Week: 3 days    Minutes of Exercise per Session: 30 min  Stress: No Stress Concern Present (06/06/2023)   [employer] of Occupational Health - Occupational Stress Questionnaire    Feeling of Stress : Not at all  Social Connections: Moderately Isolated (06/06/2023)   Social Connection and Isolation Panel [NHANES]    Frequency of Communication with Friends and Family: Once a week    Frequency of Social Gatherings with Friends and Family: More than three times a week    Attends Religious Services: Never    Database administrator or Organizations: No    Attends Banker Meetings: Never    Marital Status: Married    Tobacco Counseling Ready to quit: Not Answered Counseling given: Not Answered Tobacco comments: quit in 2017, restarted.    Clinical Intake:  Pre-visit preparation completed: Yes  Pain : No/denies pain     Nutritional Status: BMI of 19-24  Normal Nutritional Risks: None Diabetes: No  How often do you need to have someone help you when you read instructions, pamphlets, or other written materials from your doctor or pharmacy?: 1 - Never  Interpreter Needed?: No  Information entered by :: Kennedy Bucker, LPN   Activities of Daily  Living    06/06/2023    3:36 PM  In your present state of health, do you have any difficulty performing the following activities:  Hearing? 0  Vision? 0  Difficulty concentrating or making decisions? 0  Walking or climbing stairs? 0  Dressing or bathing? 0  Doing errands, shopping? 0  Preparing Food and eating ? N  Using the Toilet? N  In the past six months, have you accidently leaked urine? N  Do you have problems with loss of bowel control? N  Managing your Medications? N  Managing your Finances? N  Housekeeping or managing your Housekeeping? N    Patient Care Team: Smitty Cords, DO as PCP - General (Family  Medicine) Pasty Spillers, MD (Inactive) as Consulting Physician (Gastroenterology)  Indicate any recent Medical Services you may have received from other than [facility] providers in the past year (date may be approximate).     Assessment:   This is a routine wellness examination for Monica Chapman.  Hearing/Vision screen Hearing Screening - Comments:: No aids Vision Screening - Comments:: [facility]   Goals Addressed             This Visit's Progress    DIET - INCREASE WATER INTAKE         Depression Screen    06/06/2023    3:34 PM 11/01/2022   10:06 AM 06/01/2022    2:06 PM 06/06/2021    2:52 PM 05/30/2021    2:42 PM 02/26/2020   11:09 AM 08/11/2019   11:48 AM  PHQ 2/9 Scores  PHQ - 2 Score 0 0 0 0 0 0 0  PHQ- 9 Score 0 0 0 0       Fall Risk    06/06/2023    3:36 PM 11/01/2022   10:06 AM 06/01/2022    2:09 PM 06/06/2021    2:52 PM 05/30/2021    2:42 PM  Fall Risk   Falls in the past year? 0 0 1 0 0  Number falls in past yr: 0 0 0 0   Injury with Fall? 0 0 1 0   Risk for fall due to : No Fall Risks No Fall Risks History of fall(s)  Medication side effect  Follow up Falls prevention discussed;Falls evaluation completed Falls evaluation completed Falls evaluation completed;Falls prevention discussed Falls evaluation completed Falls evaluation  completed;Education provided;Falls prevention discussed    MEDICARE RISK AT HOME: Medicare Risk at Home Any stairs in or around the home?: Yes If so, are there any without handrails?: No Home free of loose throw rugs in walkways, pet beds, electrical cords, etc?: Yes Adequate lighting in your home to reduce risk of falls?: Yes Life alert?: No Use of a cane, walker or w/c?: No Grab bars in the bathroom?: Yes Shower chair or bench in shower?: No Elevated toilet seat or a handicapped toilet?: No  TIMED UP AND GO:  Was the test performed?  No    Cognitive Function:        06/06/2023    3:37 PM 06/01/2022    2:18 PM 05/30/2021    2:43 PM 08/11/2019   11:48 AM  6CIT Screen  What Year? 0 points 0 points 0 points 0 points  What month? 0 points 0 points 0 points 0 points  What time? 0 points 0 points 0 points 0 points  Count back from 20 0 points 0 points 0 points 0 points  Months in reverse 0 points 0 points 0 points 0 points  Repeat phrase 0 points 0 points 0 points 0 points  Total Score 0 points 0 points 0 points 0 points    Immunizations Immunization History  Administered Date(s) Administered   Fluad Quad(high Dose 65+) 07/07/2019, 06/02/2020, 06/06/2021   Influenza, High Dose Seasonal PF 11/07/2018   Influenza-Unspecified 06/22/2016   Moderna Sars-Covid-2 Vaccination 12/12/2019, 01/09/2020   Pneumococcal Conjugate-13 08/11/2019   Pneumococcal Polysaccharide-23 06/06/2021    TDAP status: Due, Education has been provided regarding the importance of this vaccine. Advised may receive this vaccine at local pharmacy or Health Dept. Aware to provide a copy of the vaccination record if obtained from local pharmacy or Health Dept. Verbalized acceptance and understanding.  Flu Vaccine status: Declined, Education has been provided regarding the importance of this vaccine but patient still declined. Advised may receive this vaccine at local pharmacy or Health Dept. Aware to provide a  copy of the vaccination record if obtained from local pharmacy or Health Dept. Verbalized acceptance and understanding.  Pneumococcal vaccine status: Up to date  Covid-19 vaccine status: Completed vaccines  Qualifies for Shingles Vaccine? Yes   Zostavax completed No   Shingrix Completed?: No.    Education has been provided regarding the importance of this vaccine. Patient has been advised to call insurance company to determine out of pocket expense if they have not yet received this vaccine. Advised may also receive vaccine at local pharmacy or Health Dept. Verbalized acceptance and understanding.  Screening Tests Health Maintenance  Topic Date Due   DTaP/Tdap/Td (1 - Tdap) Never done   Zoster Vaccines- Shingrix (1 of 2) Never done   INFLUENZA VACCINE  04/25/2023   COVID-19 Vaccine (3 - 2023-24 season) 05/26/2023   Colonoscopy  08/09/2023   Medicare Annual Wellness (AWV)  06/05/2024   MAMMOGRAM  06/28/2024   Pneumonia Vaccine 58+ Years old  Completed   DEXA SCAN  Completed   Hepatitis C Screening  Completed   HPV VACCINES  Aged Out    Health Maintenance  Health Maintenance Due  Topic Date Due   DTaP/Tdap/Td (1 - Tdap) Never done   Zoster Vaccines- Shingrix (1 of 2) Never done   INFLUENZA VACCINE  04/25/2023   COVID-19 Vaccine (3 - 2023-24 season) 05/26/2023   Colonoscopy  08/09/2023    Colorectal cancer screening: Type of screening: Colonoscopy. Completed 08/08/22. Repeat every 1 years  Mammogram status: Completed 06/28/22. Repeat every year  Bone Density status: Completed 06/27/21. Results reflect: Bone density results: OSTEOPOROSIS. Repeat every 2 years.  Lung Cancer Screening: (Low Dose CT Chest recommended if Age 70-80 years, 20 pack-year currently smoking OR have quit w/in 15years.) does not qualify.     Additional Screening:  Hepatitis C Screening: does qualify; Completed 07/14/15  Vision Screening: Recommended annual ophthalmology exams for early detection of  glaucoma and other disorders of the eye. Is the patient up to date with their annual eye exam?  Yes  Who is the provider or what is the name of the office in which the patient attends annual eye exams? [facility] If pt is not established with a provider, would they like to be referred to a provider to establish care? No .   Dental Screening: Recommended annual dental exams for proper oral hygiene    Community Resource Referral / Chronic Care Management: CRR required this visit?  No   CCM required this visit?  No     Plan:     I have personally reviewed and noted the following in the patient's chart:   Medical and social history Use of alcohol, tobacco or illicit drugs  Current medications and supplements including opioid prescriptions. Patient is not currently taking opioid prescriptions. Functional ability and status Nutritional status Physical activity Advanced directives List of other physicians Hospitalizations, surgeries, and ER visits in previous 12 months Vitals Screenings to include cognitive, depression, and falls Referrals and appointments  In addition, I have reviewed and discussed with patient certain preventive protocols, quality metrics, and best practice recommendations. A written personalized care plan for preventive services as well as general preventive health recommendations were provided to patient.     Hal Hope, LPN   1/61/0960   After Visit Summary: (  MyChart) Due to this being a telephonic visit, the after visit summary with patients personalized plan was offered to patient via MyChart   Nurse Notes: none

## 2023-06-06 NOTE — Patient Instructions (Addendum)
Monica Chapman , Thank you for taking time to come for your Medicare Wellness Visit. I appreciate your ongoing commitment to your health goals. Please review the following plan we discussed and let me know if I can assist you in the future.   Referrals/Orders/Follow-Ups/Clinician Recommendations: none  This is a list of the screening recommended for you and due dates:  Health Maintenance  Topic Date Due   DTaP/Tdap/Td vaccine (1 - Tdap) Never done   Zoster (Shingles) Vaccine (1 of 2) Never done   Flu Shot  04/25/2023   COVID-19 Vaccine (3 - 2023-24 season) 05/26/2023   Colon Cancer Screening  08/09/2023   Medicare Annual Wellness Visit  06/05/2024   Mammogram  06/28/2024   Pneumonia Vaccine  Completed   DEXA scan (bone density measurement)  Completed   Hepatitis C Screening  Completed   HPV Vaccine  Aged Out    Advanced directives: (ACP Link)Information on Advanced Care Planning can be found at [facility] Advance Health Care Directives Advance Health Care Directives ([email or web address]   Next Medicare Annual Wellness Visit scheduled for next year: Yes  06/11/24 @ 2:00 pm by phone

## 2023-07-02 ENCOUNTER — Telehealth: Payer: Self-pay

## 2023-07-02 ENCOUNTER — Other Ambulatory Visit: Payer: Self-pay | Admitting: *Deleted

## 2023-07-02 ENCOUNTER — Telehealth: Payer: Self-pay | Admitting: *Deleted

## 2023-07-02 DIAGNOSIS — Z860100 Personal history of colon polyps, unspecified: Secondary | ICD-10-CM

## 2023-07-02 MED ORDER — PEG 3350-KCL-NABCB-NACL-NASULF 236 G PO SOLR: 4000.0000 mL | Freq: Once | ORAL | 0 refills | Status: AC

## 2023-07-02 NOTE — Telephone Encounter (Signed)
Pt requesting call back to schedule colonoscopy.

## 2023-07-02 NOTE — Telephone Encounter (Signed)
Gastroenterology Pre-Procedure Review  Request Date: 08/12/2023 Requesting Physician: Dr. Tobi Bastos  PATIENT REVIEW QUESTIONS: The patient responded to the following health history questions as indicated:    1. Are you having any GI issues? no 2. Do you have a personal history of Polyps? yes (08/08/2022) 3. Do you have a family history of Colon Cancer or Polyps? no 4. Diabetes Mellitus? no 5. Joint replacements in the past 12 months?no 6. Major health problems in the past 3 months?no 7. Any artificial heart valves, MVP, or defibrillator?no    MEDICATIONS & ALLERGIES:    Patient reports the following regarding taking any anticoagulation/antiplatelet therapy:   Plavix, Coumadin, Eliquis, Xarelto, Lovenox, Pradaxa, Brilinta, or Effient? no Aspirin? no  Patient confirms/reports the following medications:  Current Outpatient Medications  Medication Sig Dispense Refill   alendronate (FOSAMAX) 70 MG tablet TAKE 1 TABLET(70 MG) BY MOUTH 1 TIME A WEEK WITH A FULL GLASS OF WATER AND ON AN EMPTY STOMACH 12 tablet 3   atorvastatin (LIPITOR) 10 MG tablet Take 1 tablet (10 mg total) by mouth at bedtime. 90 tablet 3   calcium-vitamin D (OSCAL WITH D) 500-200 MG-UNIT per tablet Take 1 tablet by mouth.     Cetirizine HCl 10 MG CAPS Take 10 mg by mouth at bedtime.     ibuprofen (ADVIL) 600 MG tablet Take 1 tablet by mouth 3 (three) times daily.     Melatonin 3 MG TABS Take by mouth as needed.     omeprazole (PRILOSEC) 20 MG capsule Take 1 capsule (20 mg total) by mouth daily before breakfast. 90 capsule 3   No current facility-administered medications for this visit.    Patient confirms/reports the following allergies:  Allergies  Allergen Reactions   Penicillins Itching    At about 72 yo with itching not serious. Did not require medical care    No orders of the defined types were placed in this encounter.   AUTHORIZATION INFORMATION Primary Insurance: 1D#: Group #:  Secondary  Insurance: 1D#: Group #:  SCHEDULE INFORMATION: Date: 08/12/2023 Time: Location:[facility]

## 2023-07-02 NOTE — Telephone Encounter (Signed)
Colonoscopy schedule on 08/12/2023 with Dr Tobi Bastos

## 2023-07-24 ENCOUNTER — Ambulatory Visit
Admission: RE | Admit: 2023-07-24 | Discharge: 2023-07-24 | Disposition: A | Discharge status: Home / Self Care | Payer: Medicare Other | Source: Ambulatory Visit | Attending: Family Medicine | Admitting: Family Medicine

## 2023-07-24 DIAGNOSIS — M810 Age-related osteoporosis without current pathological fracture: Secondary | ICD-10-CM | POA: Diagnosis not present

## 2023-07-24 DIAGNOSIS — Z780 Asymptomatic menopausal state: Secondary | ICD-10-CM | POA: Diagnosis not present

## 2023-07-24 DIAGNOSIS — Z1231 Encounter for screening mammogram for malignant neoplasm of breast: Secondary | ICD-10-CM | POA: Diagnosis not present

## 2023-08-05 ENCOUNTER — Encounter: Payer: Self-pay | Admitting: Family Medicine

## 2023-08-06 ENCOUNTER — Telehealth: Payer: Self-pay

## 2023-08-06 DIAGNOSIS — M810 Age-related osteoporosis without current pathological fracture: Secondary | ICD-10-CM

## 2023-08-06 NOTE — Telephone Encounter (Signed)
For Osteoporosis. Referral signed to [facility]  Saralyn Pilar, DO [facility] 08/06/2023, 9:58 AM

## 2023-08-06 NOTE — Telephone Encounter (Signed)
-----   Message from Saralyn Pilar sent at 08/05/2023  6:03 PM EST ----- Please contact patient to review the following (No MyChart Access): She will need a referral to Endocrine if she is agreeable. I have printed her DEXA result with all of this info in a letter and it will be mailed to the patient for her records.  ---------------------- Bone density DEXA imaging result shows progression of thinning of bones.  Compared to previous Bone Density imaging you have had years prior, both areas studied on this test (Right Hip and Forearm) have thinned more and progressed unfortunately.  It appears that the Fosamax Alendronate has not been sufficient to strength bones. It may have helped, but now that it has been >5 years on this medication. It may have run it's course.  I would suggest a referral to Endocrinology specialist to further manage the Osteoporosis and discuss newer treatment options.  We have [facility] in [place] or [address] and other locations in [place].  Please let me know if you would like the referral and to which location.  Saralyn Pilar, DO [facility] 08/05/2023, 6:02 PM

## 2023-08-06 NOTE — Telephone Encounter (Signed)
Patient aware of results and recommendations.  Ok with referral to [place].

## 2023-08-12 ENCOUNTER — Other Ambulatory Visit: Payer: Self-pay

## 2023-08-12 ENCOUNTER — Ambulatory Visit: Payer: Medicare Other | Admitting: Anesthesiology

## 2023-08-12 ENCOUNTER — Encounter: Payer: Self-pay | Admitting: Gastroenterology

## 2023-08-12 ENCOUNTER — Encounter
Admission: RE | Disposition: A | Discharge status: Home / Self Care | Payer: Self-pay | Source: Home / Self Care | Attending: Gastroenterology

## 2023-08-12 ENCOUNTER — Ambulatory Visit
Admission: RE | Admit: 2023-08-12 | Discharge: 2023-08-12 | Disposition: A | Discharge status: Home / Self Care | Payer: Medicare Other | Attending: Gastroenterology | Admitting: Gastroenterology

## 2023-08-12 DIAGNOSIS — D125 Benign neoplasm of sigmoid colon: Secondary | ICD-10-CM | POA: Diagnosis not present

## 2023-08-12 DIAGNOSIS — Z09 Encounter for follow-up examination after completed treatment for conditions other than malignant neoplasm: Secondary | ICD-10-CM | POA: Diagnosis not present

## 2023-08-12 DIAGNOSIS — K635 Polyp of colon: Secondary | ICD-10-CM | POA: Diagnosis not present

## 2023-08-12 DIAGNOSIS — Z860100 Personal history of colon polyps, unspecified: Secondary | ICD-10-CM

## 2023-08-12 DIAGNOSIS — Z1211 Encounter for screening for malignant neoplasm of colon: Secondary | ICD-10-CM | POA: Insufficient documentation

## 2023-08-12 DIAGNOSIS — Z860101 Personal history of adenomatous and serrated colon polyps: Secondary | ICD-10-CM | POA: Insufficient documentation

## 2023-08-12 DIAGNOSIS — D120 Benign neoplasm of cecum: Secondary | ICD-10-CM | POA: Insufficient documentation

## 2023-08-12 DIAGNOSIS — D123 Benign neoplasm of transverse colon: Secondary | ICD-10-CM | POA: Insufficient documentation

## 2023-08-12 HISTORY — PX: POLYPECTOMY: SHX5525

## 2023-08-12 HISTORY — PX: COLONOSCOPY WITH PROPOFOL: SHX5780

## 2023-08-12 SURGERY — COLONOSCOPY WITH PROPOFOL
Anesthesia: General

## 2023-08-12 MED ORDER — SODIUM CHLORIDE 0.9 % IV SOLN: INTRAVENOUS | Status: DC

## 2023-08-12 MED ORDER — PROPOFOL 10 MG/ML IV BOLUS
INTRAVENOUS | Status: DC | PRN
  Administered 2023-08-12: 60 mg via INTRAVENOUS
  Administered 2023-08-12 (×2): 30 mg via INTRAVENOUS

## 2023-08-12 MED ORDER — PHENYLEPHRINE 80 MCG/ML (10ML) SYRINGE FOR IV PUSH (FOR BLOOD PRESSURE SUPPORT)
PREFILLED_SYRINGE | INTRAVENOUS | Status: AC
  Filled 2023-08-12: qty 10

## 2023-08-12 MED ORDER — PHENYLEPHRINE HCL (PRESSORS) 10 MG/ML IV SOLN
INTRAVENOUS | Status: DC | PRN
  Administered 2023-08-12: 80 ug via INTRAVENOUS
  Administered 2023-08-12: 120 ug via INTRAVENOUS
  Administered 2023-08-12: 80 ug via INTRAVENOUS

## 2023-08-12 MED ORDER — PROPOFOL 500 MG/50ML IV EMUL
INTRAVENOUS | Status: DC | PRN
  Administered 2023-08-12: 125 ug/kg/min via INTRAVENOUS

## 2023-08-12 NOTE — H&P (Signed)
Wyline Mood, MD [address], [place], [place], [address] [address], [place], [place], [address] Phone: [phone]  Fax: [phone]  Primary Care Physician:  Smitty Cords, DO   Pre-Procedure History & Physical: HPI:  VERNELLE FLUET is a 72 y.o. female is here for an colonoscopy.   Past Medical History:  Diagnosis Date   Arthritis    Barrett esophagus    Hypertension     Past Surgical History:  Procedure Laterality Date   ABDOMINAL HYSTERECTOMY     COLONOSCOPY WITH PROPOFOL N/A 03/12/2019   Procedure: COLONOSCOPY WITH PROPOFOL;  Surgeon: Pasty Spillers, MD;  Location: [facility] ENDOSCOPY;  Service: Endoscopy;  Laterality: N/A;   COLONOSCOPY WITH PROPOFOL N/A 08/08/2022   Procedure: COLONOSCOPY WITH PROPOFOL;  Surgeon: Wyline Mood, MD;  Location: [facility] ENDOSCOPY;  Service: Gastroenterology;  Laterality: N/A;   ESOPHAGOGASTRODUODENOSCOPY (EGD) WITH PROPOFOL N/A 03/12/2019   Procedure: ESOPHAGOGASTRODUODENOSCOPY (EGD) WITH PROPOFOL;  Surgeon: Pasty Spillers, MD;  Location: [facility] ENDOSCOPY;  Service: Endoscopy;  Laterality: N/A;   FOOT SURGERY     HAND SURGERY      Prior to Admission medications   Medication Sig Start Date End Date Taking? Authorizing Provider  alendronate (FOSAMAX) 70 MG tablet TAKE 1 TABLET(70 MG) BY MOUTH 1 TIME A WEEK WITH A FULL GLASS OF WATER AND ON AN EMPTY STOMACH 09/26/22   Althea Charon, Netta Neat, DO  atorvastatin (LIPITOR) 10 MG tablet Take 1 tablet (10 mg total) by mouth at bedtime. 11/01/22   Karamalegos, Netta Neat, DO  calcium-vitamin D (OSCAL WITH D) 500-200 MG-UNIT per tablet Take 1 tablet by mouth.    [provider]  Cetirizine HCl 10 MG CAPS Take 10 mg by mouth at bedtime.    [provider]  ibuprofen (ADVIL) 600 MG tablet Take 1 tablet by mouth 3 (three) times daily.    [provider]  Melatonin 3 MG TABS Take by mouth as needed.    [provider]  omeprazole  (PRILOSEC) 20 MG capsule Take 1 capsule (20 mg total) by mouth daily before breakfast. 11/01/22   Smitty Cords, DO    Allergies as of 07/03/2023 - Review Complete 06/06/2023  Allergen Reaction Noted   Penicillins Itching 07/21/2014    Family History  Problem Relation Age of Onset   Breast cancer Mother 44   Breast cancer Maternal Grandmother     Social History   Socioeconomic History   Marital status: Married    Spouse name: Not on file   Number of children: Not on file   Years of education: Not on file   Highest education level: High school graduate  Occupational History   Not on file  Tobacco Use   Smoking status: Every Day    Current packs/day: 0.25    Types: Cigarettes   Smokeless tobacco: Current   Tobacco comments:    quit in 2017, restarted.   Vaping Use   Vaping status: Never Used  Substance and Sexual Activity   Alcohol use: Yes    Alcohol/week: 21.0 standard drinks of alcohol    Types: 21 Glasses of wine per week    Comment: 3 glasses wine daily   Drug use: Yes    Types: Marijuana    Comment: occasional    Sexual activity: Not Currently  Other Topics Concern   Not on file  Social History Narrative   Not on file   Social Determinants of Health   Financial Resource  Strain: Low Risk  (06/06/2023)   Overall Financial Resource Strain (CARDIA)    Difficulty of Paying Living Expenses: Not hard at all  Food Insecurity: No Food Insecurity (06/06/2023)   Hunger Vital Sign    Worried About Running Out of Food in the Last Year: Never true    Ran Out of Food in the Last Year: Never true  Transportation Needs: No Transportation Needs (06/06/2023)   PRAPARE - Administrator, Civil Service (Medical): No    Lack of Transportation (Non-Medical): No  Physical Activity: Insufficiently Active (06/06/2023)   Exercise Vital Sign    Days of Exercise per Week: 3 days    Minutes of Exercise per Session: 30 min  Stress: No Stress Concern Present  (06/06/2023)   [employer] of Occupational Health - Occupational Stress Questionnaire    Feeling of Stress : Not at all  Social Connections: Moderately Isolated (06/06/2023)   Social Connection and Isolation Panel [NHANES]    Frequency of Communication with Friends and Family: Once a week    Frequency of Social Gatherings with Friends and Family: More than three times a week    Attends Religious Services: Never    Database administrator or Organizations: No    Attends Banker Meetings: Never    Marital Status: Married  Catering manager Violence: Not At Risk (06/06/2023)   Humiliation, Afraid, Rape, and Kick questionnaire    Fear of Current or Ex-Partner: No    Emotionally Abused: No    Physically Abused: No    Sexually Abused: No    Review of Systems: See HPI, otherwise negative ROS  Physical Exam: There were no vitals taken for this visit. General:   Alert,  pleasant and cooperative in NAD Head:  Normocephalic and atraumatic. Neck:  Supple; no masses or thyromegaly. Lungs:  Clear throughout to auscultation, normal respiratory effort.    Heart:  +S1, +S2, Regular rate and rhythm, No edema. Abdomen:  Soft, nontender and nondistended. Normal bowel sounds, without guarding, and without rebound.   Neurologic:  Alert and  oriented x4;  grossly normal neurologically.  Impression/Plan: WINTA SPENGLER is here for an colonoscopy to be performed for surveillance due to prior history of colon polyps   Risks, benefits, limitations, and alternatives regarding  colonoscopy have been reviewed with the patient.  Questions have been answered.  All parties agreeable.   Wyline Mood, MD  08/12/2023, 10:01 AM

## 2023-08-12 NOTE — Op Note (Signed)
[facility] Gastroenterology Patient Name: Monica Chapman Procedure Date: 08/12/2023 9:52 AM MRN: [id number] Account #: [id number] Date of Birth: Oct 18, 1950 Admit Type: Outpatient Age: 72 Room: [facility] ENDO ROOM 1 Gender: Female Note Status: Finalized Instrument Name: Nelda Marseille [id number] Procedure:             Colonoscopy Indications:           Surveillance: History of numerous (> 10) adenomas on                         last colonoscopy (< 3 yrs), Last colonoscopy: November                         2023 Providers:             Wyline Mood MD, MD Referring MD:          Wyline Mood MD, MD (Referring MD), Smitty Cords (Referring MD) Medicines:             Monitored Anesthesia Care Complications:         No immediate complications. Procedure:             Pre-Anesthesia Assessment:                        - Prior to the procedure, a History and Physical was                         performed, and patient medications, allergies and                         sensitivities were reviewed. The patient's tolerance                         of previous anesthesia was reviewed.                        - The risks and benefits of the procedure and the                         sedation options and risks were discussed with the                         patient. All questions were answered and informed                         consent was obtained.                        - ASA Grade Assessment: II - A patient with mild                         systemic disease.                        After obtaining informed consent, the colonoscope was                         passed under direct vision.  Throughout the procedure,                         the patient's blood pressure, pulse, and oxygen                         saturations were monitored continuously. The                         Colonoscope was introduced through the anus and                         advanced to the  the cecum, identified by the                         appendiceal orifice. The colonoscopy was performed                         with ease. The patient tolerated the procedure well.                         The quality of the bowel preparation was excellent.                         The ileocecal valve, appendiceal orifice, and rectum                         were photographed. Findings:      The perianal and digital rectal examinations were normal.      Four sessile polyps were found in the sigmoid colon. The polyps were 4       to 5 mm in size. These polyps were removed with a cold snare. Resection       and retrieval were complete.      Three sessile polyps were found in the sigmoid colon, transverse colon       and cecum. The polyps were 2 to 3 mm in size. These polyps were removed       with a cold biopsy forceps. Resection and retrieval were complete.      The exam was otherwise without abnormality on direct and retroflexion       views. Impression:            - Four 4 to 5 mm polyps in the sigmoid colon, removed                         with a cold snare. Resected and retrieved.                        - Three 2 to 3 mm polyps in the sigmoid colon, in the                         transverse colon and in the cecum, removed with a cold                         biopsy forceps. Resected and retrieved.                        - The examination was otherwise normal on direct  and                         retroflexion views. Recommendation:        - Discharge patient to home (with escort).                        - Resume previous diet.                        - Continue present medications.                        - Await pathology results.                        - Repeat colonoscopy in 2 years for surveillance. Procedure Code(s):     --- Professional ---                        [phone], Colonoscopy, flexible; with removal of                         tumor(s), polyp(s), or other lesion(s) by snare                          technique                        45380, 59, Colonoscopy, flexible; with biopsy, single                         or multiple Diagnosis Code(s):     --- Professional ---                        Z86.010, Personal history of colonic polyps                        D12.5, Benign neoplasm of sigmoid colon                        D12.3, Benign neoplasm of transverse colon (hepatic                         flexure or splenic flexure)                        D12.0, Benign neoplasm of cecum CPT copyright 2022 American Medical Association. All rights reserved. The codes documented in this report are preliminary and upon coder review may  be revised to meet current compliance requirements. Wyline Mood, MD Wyline Mood MD, MD 08/12/2023 10:50:17 AM This report has been signed electronically. Number of Addenda: 0 Note Initiated On: 08/12/2023 9:52 AM Scope Withdrawal Time: 0 hours 12 minutes 21 seconds  Total Procedure Duration: 0 hours 16 minutes 4 seconds  Estimated Blood Loss:  Estimated blood loss: none.      [facility]

## 2023-08-12 NOTE — Anesthesia Postprocedure Evaluation (Signed)
Anesthesia Post Note  Patient: Monica Chapman  Procedure(s) Performed: COLONOSCOPY WITH PROPOFOL POLYPECTOMY  Patient location during evaluation: Endoscopy Anesthesia Type: General Level of consciousness: awake and alert Pain management: pain level controlled Vital Signs Assessment: post-procedure vital signs reviewed and stable Respiratory status: spontaneous breathing, nonlabored ventilation, respiratory function stable and patient connected to nasal cannula oxygen Cardiovascular status: blood pressure returned to baseline and stable Postop Assessment: no apparent nausea or vomiting Anesthetic complications: no   No notable events documented.   Last Vitals:  Vitals:   08/12/23 1052 08/12/23 1102  BP: 99/64 109/67  Pulse: 74 75  Resp: 15 15  Temp: (!) 36.1 C   SpO2: 100% 99%    Last Pain:  Vitals:   08/12/23 1102  TempSrc:   PainSc: 0-No pain                 Louie Boston

## 2023-08-12 NOTE — Transfer of Care (Signed)
Immediate Anesthesia Transfer of Care Note  Patient: Monica Chapman  Procedure(s) Performed: COLONOSCOPY WITH PROPOFOL POLYPECTOMY  Patient Location: PACU  Anesthesia Type:General  Level of Consciousness: drowsy  Airway & Oxygen Therapy: Patient Spontanous Breathing  Post-op Assessment: Report given to RN and Post -op Vital signs reviewed and stable  Post vital signs: Reviewed and stable  Last Vitals:  Vitals Value Taken Time  BP 99/64 08/12/23 1052  Temp 36.1 C 08/12/23 1052  Pulse 74 08/12/23 1053  Resp 15 08/12/23 1052  SpO2 100 % 08/12/23 1053  Vitals shown include unfiled device data.  Last Pain:  Vitals:   08/12/23 1052  TempSrc: Tympanic  PainSc: Asleep         Complications: No notable events documented.

## 2023-08-12 NOTE — Anesthesia Preprocedure Evaluation (Signed)
Anesthesia Evaluation  Patient identified by MRN, date of birth, ID band Patient awake    Reviewed: Allergy & Precautions, NPO status , Patient's Chart, lab work & pertinent test results  History of Anesthesia Complications Negative for: history of anesthetic complications  Airway Mallampati: II  TM Distance: >3 FB Neck ROM: full    Dental no notable dental hx.    Pulmonary neg pulmonary ROS, Current Smoker   Pulmonary exam normal        Cardiovascular hypertension, negative cardio ROS Normal cardiovascular exam     Neuro/Psych  Neuromuscular disease  negative psych ROS   GI/Hepatic negative GI ROS, Neg liver ROS,,,  Endo/Other  negative endocrine ROS    Renal/GU negative Renal ROS  negative genitourinary   Musculoskeletal   Abdominal   Peds  Hematology negative hematology ROS (+)   Anesthesia Other Findings Past Medical History: No date: Arthritis No date: Barrett esophagus No date: Hypertension  Past Surgical History: No date: ABDOMINAL HYSTERECTOMY 03/12/2019: COLONOSCOPY WITH PROPOFOL; N/A     Comment:  Procedure: COLONOSCOPY WITH PROPOFOL;  Surgeon:               Pasty Spillers, MD;  Location: [facility] ENDOSCOPY;                Service: Endoscopy;  Laterality: N/A; 08/08/2022: COLONOSCOPY WITH PROPOFOL; N/A     Comment:  Procedure: COLONOSCOPY WITH PROPOFOL;  Surgeon: Wyline Mood, MD;  Location: [facility] ENDOSCOPY;  Service:               Gastroenterology;  Laterality: N/A; 03/12/2019: ESOPHAGOGASTRODUODENOSCOPY (EGD) WITH PROPOFOL; N/A     Comment:  Procedure: ESOPHAGOGASTRODUODENOSCOPY (EGD) WITH               PROPOFOL;  Surgeon: Pasty Spillers, MD;  Location:               [facility] ENDOSCOPY;  Service: Endoscopy;  Laterality: N/A; No date: FOOT SURGERY No date: HAND SURGERY     Reproductive/Obstetrics negative OB ROS                             Anesthesia  Physical Anesthesia Plan  ASA: 2  Anesthesia Plan: General   Post-op Pain Management: Minimal or no pain anticipated   Induction: Intravenous  PONV Risk Score and Plan: 2 and Propofol infusion and TIVA  Airway Management Planned: Natural Airway and Nasal Cannula  Additional Equipment:   Intra-op Plan:   Post-operative Plan:   Informed Consent: I have reviewed the patients History and Physical, chart, labs and discussed the procedure including the risks, benefits and alternatives for the proposed anesthesia with the patient or authorized representative who has indicated his/her understanding and acceptance.     Dental Advisory Given  Plan Discussed with: Anesthesiologist, CRNA and Surgeon  Anesthesia Plan Comments: (Patient consented for risks of anesthesia including but not limited to:  - adverse reactions to medications - risk of airway placement if required - damage to eyes, teeth, lips or other oral mucosa - nerve damage due to positioning  - sore throat or hoarseness - Damage to heart, brain, nerves, lungs, other parts of body or loss of life  Patient voiced understanding and assent.)       Anesthesia Quick Evaluation

## 2023-08-13 ENCOUNTER — Encounter: Payer: Self-pay | Admitting: Gastroenterology

## 2023-08-14 LAB — SURGICAL PATHOLOGY

## 2023-08-15 ENCOUNTER — Encounter: Payer: Self-pay | Admitting: Gastroenterology

## 2023-08-19 ENCOUNTER — Telehealth: Payer: Self-pay

## 2023-08-19 NOTE — Telephone Encounter (Signed)
I do not see where patient was seen in office recently for labs. Will contact patient for further information.

## 2023-08-19 NOTE — Telephone Encounter (Signed)
Copied from CRM [id number]. Topic: General - Other >> Aug 19, 2023  9:47 AM Franchot Heidelberg wrote: Reason for CRM: Pt called for update on lab work, please advise

## 2023-08-28 ENCOUNTER — Telehealth: Payer: Self-pay | Admitting: Family Medicine

## 2023-08-28 NOTE — Telephone Encounter (Signed)
Patient has called and states that she can not get a call back from the Endocrinologist to schedule an appointment for her and she was wanting to know before she requests a refill on medication alendronate (FOSAMAX) 70 MG tablet, if Dr Kirtland Bouchard wants to put her on a different medication. Patient states she was told that she had been on alendronate (FOSAMAX) 70 MG tablet for 5 years and that is why she needs to go to an Endocrinologist for a different medication but they won't call her back for scheduling.  Please advise.  Patient's callback #  [phone] OR [phone] (Cell phone)

## 2023-08-29 NOTE — Telephone Encounter (Signed)
Called patient to advise she had been referred to [facility] Endo. She states they reached out to her yesterday and she has an appointment scheduled for tomorrow. Did not need anything further from [place] at this time.

## 2023-08-30 DIAGNOSIS — M810 Age-related osteoporosis without current pathological fracture: Secondary | ICD-10-CM | POA: Diagnosis not present

## 2023-08-30 DIAGNOSIS — E559 Vitamin D deficiency, unspecified: Secondary | ICD-10-CM | POA: Diagnosis not present

## 2023-08-30 DIAGNOSIS — Z720 Tobacco use: Secondary | ICD-10-CM | POA: Diagnosis not present

## 2023-09-03 DIAGNOSIS — H35032 Hypertensive retinopathy, left eye: Secondary | ICD-10-CM | POA: Diagnosis not present

## 2023-09-03 DIAGNOSIS — H26493 Other secondary cataract, bilateral: Secondary | ICD-10-CM | POA: Diagnosis not present

## 2023-09-03 DIAGNOSIS — H43813 Vitreous degeneration, bilateral: Secondary | ICD-10-CM | POA: Diagnosis not present

## 2023-09-03 DIAGNOSIS — H02885 Meibomian gland dysfunction left lower eyelid: Secondary | ICD-10-CM | POA: Diagnosis not present

## 2023-10-08 DIAGNOSIS — M810 Age-related osteoporosis without current pathological fracture: Secondary | ICD-10-CM | POA: Diagnosis not present

## 2023-10-08 DIAGNOSIS — R69 Illness, unspecified: Secondary | ICD-10-CM | POA: Diagnosis not present

## 2023-10-16 DIAGNOSIS — M810 Age-related osteoporosis without current pathological fracture: Secondary | ICD-10-CM | POA: Diagnosis not present

## 2023-11-26 DIAGNOSIS — H02831 Dermatochalasis of right upper eyelid: Secondary | ICD-10-CM | POA: Diagnosis not present

## 2023-11-26 DIAGNOSIS — H26493 Other secondary cataract, bilateral: Secondary | ICD-10-CM | POA: Diagnosis not present

## 2023-11-26 DIAGNOSIS — H26492 Other secondary cataract, left eye: Secondary | ICD-10-CM | POA: Diagnosis not present

## 2023-11-26 DIAGNOSIS — Z961 Presence of intraocular lens: Secondary | ICD-10-CM | POA: Diagnosis not present

## 2023-11-26 DIAGNOSIS — H18413 Arcus senilis, bilateral: Secondary | ICD-10-CM | POA: Diagnosis not present

## 2023-11-30 ENCOUNTER — Other Ambulatory Visit: Payer: Self-pay | Admitting: Family Medicine

## 2023-11-30 DIAGNOSIS — K2950 Unspecified chronic gastritis without bleeding: Secondary | ICD-10-CM

## 2023-12-02 NOTE — Telephone Encounter (Signed)
 Requested Prescriptions  Pending Prescriptions Disp Refills   omeprazole (PRILOSEC) 20 MG capsule [Pharmacy Med Name: OMEPRAZOLE 20MG  CAPSULES] 90 capsule 1    Sig: TAKE 1 CAPSULE(20 MG) BY MOUTH DAILY BEFORE BREAKFAST     Gastroenterology: Proton Pump Inhibitors Passed - 12/02/2023  3:41 PM      Passed - Valid encounter within last 12 months    Recent Outpatient Visits           1 year ago Essential hypertension   [place] [facility] [place], Netta Neat, DO   2 years ago Essential hypertension   [facility] Smitty Cords, DO   3 years ago Essential hypertension   [place] [facility] Smitty Cords, DO   3 years ago Essential hypertension   [facility] [facility] Smitty Cords, DO   4 years ago Unexplained weight loss   [facility] [place], Netta Neat, [place]

## 2023-12-03 DIAGNOSIS — M810 Age-related osteoporosis without current pathological fracture: Secondary | ICD-10-CM | POA: Diagnosis not present

## 2023-12-10 DIAGNOSIS — H26491 Other secondary cataract, right eye: Secondary | ICD-10-CM | POA: Diagnosis not present

## 2023-12-25 ENCOUNTER — Other Ambulatory Visit: Payer: Self-pay | Admitting: Family Medicine

## 2023-12-25 DIAGNOSIS — E782 Mixed hyperlipidemia: Secondary | ICD-10-CM

## 2023-12-26 NOTE — Telephone Encounter (Signed)
 Requested medications are due for refill today.  yes  Requested medications are on the active medications list.  yes  Last refill. 11/01/2022 #90 3 rf  Future visit scheduled.   no  Notes to clinic Expired labs.    Requested Prescriptions  Pending Prescriptions Disp Refills   atorvastatin (LIPITOR) 10 MG tablet [Pharmacy Med Name: ATORVASTATIN 10MG  TABLETS] 90 tablet 3    Sig: TAKE 1 TABLET(10 MG) BY MOUTH AT BEDTIME     Cardiovascular:  Antilipid - Statins Failed - 12/26/2023  3:47 PM      Failed - Valid encounter within last 12 months    Recent Outpatient Visits   None            Failed - Lipid Panel in normal range within the last 12 months    Cholesterol, Total  Date Value Ref Range Status  11/02/2022 263 (H) 100 - 199 mg/dL Final   LDL Cholesterol (Calc)  Date Value Ref Range Status  09/04/2017 101 (H) mg/dL (calc) Final    Comment:    Reference range: <100 . Desirable range <100 mg/dL for primary prevention;   <70 mg/dL for patients with CHD or diabetic patients  with > or = 2 CHD risk factors. Marland Kitchen LDL-C is now calculated using the Martin-Hopkins  calculation, which is a validated novel method providing  better accuracy than the Friedewald equation in the  estimation of LDL-C.  Horald Pollen et al. Lenox Ahr. 1610;960(45): 2061-2068  (http://education.[facility].com/faq/FAQ164)    LDL Chol Calc (NIH)  Date Value Ref Range Status  11/02/2022 160 (H) 0 - 99 mg/dL Final   HDL  Date Value Ref Range Status  11/02/2022 80 >39 mg/dL Final   Triglycerides  Date Value Ref Range Status  11/02/2022 130 0 - 149 mg/dL Final         Passed - Patient is not pregnant

## 2023-12-27 ENCOUNTER — Other Ambulatory Visit: Payer: Self-pay | Admitting: Family Medicine

## 2023-12-27 DIAGNOSIS — E782 Mixed hyperlipidemia: Secondary | ICD-10-CM

## 2023-12-27 NOTE — Telephone Encounter (Signed)
 Requested Prescriptions  Pending Prescriptions Disp Refills   atorvastatin (LIPITOR) 10 MG tablet 90 tablet 3    Sig: Take 1 tablet (10 mg total) by mouth at bedtime.     Cardiovascular:  Antilipid - Statins Failed - 12/27/2023  4:51 PM      Failed - Valid encounter within last 12 months    Recent Outpatient Visits   None            Failed - Lipid Panel in normal range within the last 12 months    Cholesterol, Total  Date Value Ref Range Status  11/02/2022 263 (H) 100 - 199 mg/dL Final   LDL Cholesterol (Calc)  Date Value Ref Range Status  09/04/2017 101 (H) mg/dL (calc) Final    Comment:    Reference range: <100 . Desirable range <100 mg/dL for primary prevention;   <70 mg/dL for patients with CHD or diabetic patients  with > or = 2 CHD risk factors. Marland Kitchen LDL-C is now calculated using the Martin-Hopkins  calculation, which is a validated novel method providing  better accuracy than the Friedewald equation in the  estimation of LDL-C.  Horald Pollen et al. Lenox Ahr. 1610;960(45): 2061-2068  (http://education.[facility].com/faq/FAQ164)    LDL Chol Calc (NIH)  Date Value Ref Range Status  11/02/2022 160 (H) 0 - 99 mg/dL Final   HDL  Date Value Ref Range Status  11/02/2022 80 >39 mg/dL Final   Triglycerides  Date Value Ref Range Status  11/02/2022 130 0 - 149 mg/dL Final         Passed - Patient is not pregnant

## 2023-12-27 NOTE — Telephone Encounter (Signed)
 Copied from CRM [id number]. Topic: Clinical - Medication Refill >> Dec 27, 2023 10:10 AM Carlatta H wrote: Most Recent Primary Care Visit:  Provider: Hal Hope  Department: [id number]  Visit Type: MEDICARE AWV, SEQUENTIAL  Date: 06/06/2023  Medication: atorvastatin (LIPITOR) 10 MG tablet [[id number]]  Has the patient contacted their pharmacy? No (Agent: If no, request that the patient contact the pharmacy for the refill. If patient does not wish to contact the pharmacy document the reason why and proceed with request.) (Agent: If yes, when and what did the pharmacy advise?)  Is this the correct pharmacy for this prescription? Yes If no, delete pharmacy and type the correct one.  This is the patient's preferred pharmacy:    [id number] - [place], [place] - [address] AT [facility] OF [address] [place] [address] Phone: [phone] Fax: [phone]   Has the prescription been filled recently? No  Is the patient out of the medication? Yes  Has the patient been seen for an appointment in the last year OR does the patient have an upcoming appointment? Yes  Can we respond through MyChart? No  Agent: Please be advised that Rx refills may take up to 3 business days. We ask that you follow-up with your pharmacy.

## 2024-01-01 ENCOUNTER — Telehealth: Payer: Self-pay

## 2024-01-01 NOTE — Telephone Encounter (Signed)
 Copied from CRM [id number]. Topic: Clinical - Prescription Issue >> Jan 01, 2024 10:01 AM Marland Kitchen D wrote: Patient says she her pharmacy says her prescription was denied for- atorvastatin (LIPITOR) 10 MG tablet But she was told by the office that she had 3 refills she would like a call back with a update on this

## 2024-01-02 ENCOUNTER — Encounter: Payer: Self-pay | Admitting: Family Medicine

## 2024-01-02 ENCOUNTER — Ambulatory Visit (INDEPENDENT_AMBULATORY_CARE_PROVIDER_SITE_OTHER): Admitting: Family Medicine

## 2024-01-02 VITALS — BP 130/78 | HR 74 | Ht 62.0 in | Wt 134.5 lb

## 2024-01-02 DIAGNOSIS — Z23 Encounter for immunization: Secondary | ICD-10-CM | POA: Diagnosis not present

## 2024-01-02 DIAGNOSIS — E559 Vitamin D deficiency, unspecified: Secondary | ICD-10-CM | POA: Diagnosis not present

## 2024-01-02 DIAGNOSIS — E782 Mixed hyperlipidemia: Secondary | ICD-10-CM | POA: Diagnosis not present

## 2024-01-02 DIAGNOSIS — M810 Age-related osteoporosis without current pathological fracture: Secondary | ICD-10-CM | POA: Diagnosis not present

## 2024-01-02 MED ORDER — ATORVASTATIN CALCIUM 10 MG PO TABS: 10.0000 mg | ORAL_TABLET | Freq: Every day | ORAL | 3 refills | Status: AC

## 2024-01-02 NOTE — Progress Notes (Signed)
 Subjective:    Patient ID: Monica Chapman, female    DOB: [birth year], 73 y.o.   MRN: [id number]  Monica Chapman is a 73 y.o. female presenting on 01/02/2024 for Hyperlipidemia and Osteoporosis   HPI  Discussed the use of AI scribe software for clinical note transcription with the patient, who gave verbal consent to proceed.  History of Present Illness   Monica Chapman is a 73 year old female who presents for a medication refill.  Hyperlipidemia Last lab 2024. Due now She is here primarily for a refill of her atorvastatin prescription, which she takes at a dose of 10 mg daily. She receives a 90-day supply with refills sent to her local [facility].  GERD She takes omeprazole 20 mg daily for stomach acid, which was previously reduced from 40 mg. She has a sufficient supply and does not require a refill at this time.  Osteoporosis Followed by Endocrinology She receives Prolia injections for bone health, with her last injection in December and the next scheduled for July. She is unsure about the timing of her next bone density scan but believes it is typically done every two years.  Vitamin D Deficiency She has been taking vitamin D at a dose of 5000 IU daily since December, which she purchases over the counter. This was adjusted from a previous prescription of 50,000 IU weekly.  She has received two shingles vaccinations, likely between 2020 and 2021. She is considering an updated pneumonia vaccination, as her last Pneumovax 23 was in 2022.  She takes melatonin, allergy pills, and aspirin at night to aid with sleep, which she reports is satisfactory.     No longer has issue with elevated BP due to weight loss  Health Maintenance: Prevnar 20 today  Last Colonoscopy 2024. Next due 2 years approx 2026 per GI     01/02/2024   10:10 AM 06/06/2023    3:34 PM 11/01/2022   10:06 AM  Depression screen PHQ 2/9  Decreased Interest 0 0 0  Down, Depressed, Hopeless 0 0 0  PHQ - 2  Score 0 0 0  Altered sleeping 0 0 0  Tired, decreased energy 0 0 0  Change in appetite 0 0 0  Feeling bad or failure about yourself  0 0 0  Trouble concentrating 0 0 0  Moving slowly or fidgety/restless 0 0 0  Suicidal thoughts 0 0 0  PHQ-9 Score 0 0 0  Difficult doing work/chores  Not difficult at all Not difficult at all       01/02/2024   10:10 AM 11/01/2022   10:06 AM 06/06/2021    2:52 PM 02/20/2019   10:01 AM  GAD 7 : Generalized Anxiety Score  Nervous, Anxious, on Edge 0 0 0 1  Control/stop worrying 0 0 0 0  Worry too much - different things 0 0 0 0  Trouble relaxing 0 0 0 0  Restless 0 0 0 0  Easily annoyed or irritable 0 0 0 0  Afraid - awful might happen 0 0 0 0  Total GAD 7 Score 0 0 0 1  Anxiety Difficulty  Not difficult at all Not difficult at all Not difficult at all    Social History   Tobacco Use   Smoking status: Every Day    Current packs/day: 0.25    Types: Cigarettes   Smokeless tobacco: Never   Tobacco comments:    quit in 2017, restarted.   Vaping Use   Vaping  status: Never Used  Substance Use Topics   Alcohol use: Yes    Alcohol/week: 21.0 standard drinks of alcohol    Types: 21 Glasses of wine per week    Comment: 3 glasses wine daily   Drug use: Yes    Types: Marijuana    Comment: occasional     Review of Systems  Constitutional:  Negative for activity change, appetite change, chills, diaphoresis, fatigue and fever.  HENT:  Negative for congestion and hearing loss.   Eyes:  Negative for visual disturbance.  Respiratory:  Negative for cough, chest tightness, shortness of breath and wheezing.   Cardiovascular:  Negative for chest pain, palpitations and leg swelling.  Gastrointestinal:  Negative for abdominal pain, constipation, diarrhea, nausea and vomiting.  Genitourinary:  Negative for dysuria, frequency and hematuria.  Musculoskeletal:  Negative for arthralgias and neck pain.  Skin:  Negative for rash.  Neurological:  Negative for  dizziness, weakness, light-headedness, numbness and headaches.  Hematological:  Negative for adenopathy.  Psychiatric/Behavioral:  Negative for behavioral problems, dysphoric mood and sleep disturbance.    Per HPI unless specifically indicated above     Objective:    BP 130/78 (BP Location: Right Arm, Patient Position: Sitting, Cuff Size: Normal)   Pulse 74   Ht 5\' 2"  (1.575 m)   Wt 134 lb 8 oz (61 kg)   BMI 24.60 kg/m   Wt Readings from Last 3 Encounters:  01/02/24 134 lb 8 oz (61 kg)  08/12/23 126 lb (57.2 kg)  11/01/22 127 lb 9.6 oz (57.9 kg)    Physical Exam Vitals and nursing note reviewed.  Constitutional:      General: She is not in acute distress.    Appearance: She is well-developed. She is not diaphoretic.     Comments: Well-appearing, comfortable, cooperative  HENT:     Head: Normocephalic and atraumatic.  Eyes:     General:        Right eye: No discharge.        Left eye: No discharge.     Conjunctiva/sclera: Conjunctivae normal.     Pupils: Pupils are equal, round, and reactive to light.  Neck:     Thyroid: No thyromegaly.  Cardiovascular:     Rate and Rhythm: Normal rate and regular rhythm.     Pulses: Normal pulses.     Heart sounds: Normal heart sounds. No murmur heard. Pulmonary:     Effort: Pulmonary effort is normal. No respiratory distress.     Breath sounds: Normal breath sounds. No wheezing or rales.  Abdominal:     General: Bowel sounds are normal. There is no distension.     Palpations: Abdomen is soft. There is no mass.     Tenderness: There is no abdominal tenderness.  Musculoskeletal:        General: No tenderness. Normal range of motion.     Cervical back: Normal range of motion and neck supple.     Comments: Upper / Lower Extremities: - Normal muscle tone, strength bilateral upper extremities 5/5, lower extremities 5/5  Lymphadenopathy:     Cervical: No cervical adenopathy.  Skin:    General: Skin is warm and dry.     Findings: No  erythema or rash.  Neurological:     Mental Status: She is alert and oriented to person, place, and time.     Comments: Distal sensation intact to light touch all extremities  Psychiatric:        Mood and Affect: Mood normal.  Behavior: Behavior normal.        Thought Content: Thought content normal.     Comments: Well groomed, good eye contact, normal speech and thoughts     Results for orders placed or performed during the hospital encounter of 08/12/23  Surgical pathology   Collection Time: 08/12/23 12:00 AM  Result Value Ref Range   SURGICAL PATHOLOGY      SURGICAL PATHOLOGY [facility] [address], [address] [place], [place] [address] Telephone [phone] or [phone] Fax [phone]  REPORT OF SURGICAL PATHOLOGY   Accession #: [id number] Patient Name: DESSIE, TATEM Visit # : [id number]  MRN: [id number] Physician: Wyline Mood DOB/Age [birth year] (Age: 55) Gender: F Collected Date: 08/12/2023 Received Date: 08/12/2023  FINAL DIAGNOSIS       1. Cecum Polyp, CBX :       -  TUBULAR ADENOMA, TWO FRAGMENTS.NEGATIVE FOR HIGH GRADE DYSPLASIA OR      MALIGNANCY.      -  COLONIC MUCOSA, TWO FRAGMENTS.  NEGATIVE FOR DYSPLASIA OR MALIGNANCY.       2. Transverse Colon Polyp, CBX :       -  TUBULAR ADENOMA, ONE FRAGMENT.NEGATIVE FOR HIGH GRADE DYSPLASIA OR      MALIGNANCY.      -  COLONIC MUCOSA, ONE FRAGMENT.  NEGATIVE FOR DYSPLASIA OR MALIGNANCY.       3. Sigmoid  Colon Polyp, CBX x1 Cold snare x4 :       - HYPERPLASTIC POLYP, FOUR FRAGMENTS.NEGATIVE FOR DYSPLASIA OR MALIGNANCY.      -  COLONIC MUCOSA, ONE FRAGMENT.  NEGATIVE FOR DYSPLASIA OR MALIGNANCY.       ELECTRONIC SIGNATURE : [place] Md, Merchant navy officer, Sports administrator, International aid/development worker  MICROSCOPIC DESCRIPTION  CASE COMMENTS STAINS USED IN DIAGNOSIS: H&E H&E H&E    CLINICAL HISTORY  SPECIMEN(S) OBTAINED 1. Cecum Polyp, CBX 2. Transverse Colon Polyp, CBX 3. Sigmoid   Colon Polyp, CBX X1 Cold Snare X4  SPECIMEN COMMENTS: SPECIMEN CLINICAL INFORMATION: 1. Personal history of colon polyps.Colon polyps    Gross Description 1. Received in formalin, labeled "cbx cecal colon polyp", is a 0.5 x 0.4 x 0.2 cm aggregate of three soft, tan tissue fragments submitted entirely in block 1A. 2. Received in formalin, labeled "cbx transverse colon polyp", is a 0.4 x 0.4 x 0.2 cm aggregate of two soft, tan-gray tissue fragments submitted entirely in block 2A. 3. Received in formalin, labeled "cbx sigmoid colon polyp x 1 cold snare colon polyps x 4", is a 1.0 x 1.0 x 0.3 cm aggregate of multiple tan pol ypoid tissue fragments submitted entirely in block 3A.(SB:gt, 08/13/23)        Report signed out from the following location(s) [place]. [facility] [address], [place] [address] CLIA #: [id number]  [facility] [address] [place], [place] [address] CLIA #: [id number]       Assessment & Plan:   Problem List Items Addressed This Visit     Hyperlipidemia - Primary   Relevant Medications   atorvastatin (LIPITOR) 10 MG tablet   Osteoporosis   Relevant Medications   denosumab (PROLIA) 60 MG/ML SOSY injection   Cholecalciferol (VITAMIN D3) 125 MCG (5000 UT) CAPS   Vitamin D deficiency   Other Visit Diagnoses       Need for Streptococcus pneumoniae vaccination       Relevant Orders   Pneumococcal conjugate vaccine 20-valent (Completed)  Hyperlipidemia Chronic hyperlipidemia managed with atorvastatin. No recent cholesterol assessment.  Declined coronary artery disease screening. - Refill atorvastatin 10 mg for 90 days at [facility]. For up to 1 year  Osteoporosis Followed by Endocrinology Osteoporosis managed with Prolia. Next injection in July. 2024  Bone density scan schedule to be confirmed with endocrinology. - Follow with Endocrine to administer Prolia injection in July. - Coordinate with  endocrinology for bone density scan schedule.  Vitamin D Deficiency Currently taking 5,000 IU daily as per endocrinology's recommendation. - Continue vitamin D 5,000 IU daily.  Gastroesophageal Reflux Disease (GERD) Chronic GERD managed with omeprazole. Sufficient supply available. - Continue omeprazole 20 mg daily as needed.  General Health Maintenance Up to date on shingles vaccines. Eligible for updated pneumonia vaccine. Discussed benefits against bacterial infections. - Administer updated pneumonia vaccine. - Plan for colonoscopy in 2026. . - Recommend annual labs with cholesterol and other testiing, she declined today, reconsider next year  Follow-up - Contact the office for next annual visit.        Orders Placed This Encounter  Procedures   Pneumococcal conjugate vaccine 20-valent    Meds ordered this encounter  Medications   atorvastatin (LIPITOR) 10 MG tablet    Sig: Take 1 tablet (10 mg total) by mouth at bedtime.    Dispense:  90 tablet    Refill:  3    Follow up plan: Return in about 1 year (around 01/01/2025) for 1 Year Annual .    Saralyn Pilar, DO [facility] 01/02/2024, 10:18 AM

## 2024-01-02 NOTE — Patient Instructions (Addendum)
 Thank you for coming to the office today.  Refilled Atorvastatin  Keep up with Endocrine  Prevnar-20 pneumonia vaccine   Consider future Coronary Calcium Score Cardiac CT Scan. This is a screening test for patients aged 71-50+ with cardiovascular risk factors or who are healthy but would be interested in Cardiovascular Screening for heart disease. Even if there is a family history of heart disease, this imaging can be useful. Typically it can be done every 5+ years or at a different timeline we agree on  The scan will look at the chest and mainly focus on the heart and identify early signs of calcium build up or blockages within the heart arteries. It is not 100% accurate for identifying blockages or heart disease, but it is useful to help [place] predict who may have some early changes or be at risk in the future for a heart attack or cardiovascular problem.  The results are reviewed by a Cardiologist and they will document the results. It should become available on MyChart. Typically the results are divided into percentiles based on other patients of the same demographic and age. So it will compare your risk to others similar to you. If you have a higher score >99 or higher percentile >75%tile, it is recommended to consider Statin cholesterol therapy and or referral to Cardiologist. I will try to help explain your results and if we have questions we can contact the Cardiologist.  You will be contacted for scheduling. Usually it is done at any imaging facility through [facility], [facility] or [facility].  The cost is $99 flat fee total and it does not go through insurance, so no authorization is required.   Please schedule a Follow-up Appointment to: Return in about 1 year (around 01/01/2025) for 1 Year Annual .  If you have any other questions or concerns, please feel free to call the office or send a message through MyChart. You may also schedule an earlier  appointment if necessary.  Additionally, you may be receiving a survey about your experience at our office within a few days to 1 week by e-mail or mail. We value your feedback.  Saralyn Pilar, DO [facility], [place]

## 2024-03-17 DIAGNOSIS — M810 Age-related osteoporosis without current pathological fracture: Secondary | ICD-10-CM | POA: Diagnosis not present

## 2024-04-15 DIAGNOSIS — E559 Vitamin D deficiency, unspecified: Secondary | ICD-10-CM | POA: Diagnosis not present

## 2024-04-15 DIAGNOSIS — M810 Age-related osteoporosis without current pathological fracture: Secondary | ICD-10-CM | POA: Diagnosis not present

## 2024-05-27 DIAGNOSIS — L821 Other seborrheic keratosis: Secondary | ICD-10-CM | POA: Diagnosis not present

## 2024-05-29 ENCOUNTER — Other Ambulatory Visit: Payer: Self-pay | Admitting: Family Medicine

## 2024-05-29 DIAGNOSIS — K2950 Unspecified chronic gastritis without bleeding: Secondary | ICD-10-CM

## 2024-05-29 NOTE — Telephone Encounter (Signed)
 Requested Prescriptions  Pending Prescriptions Disp Refills   omeprazole  (PRILOSEC) 20 MG capsule [Pharmacy Med Name: OMEPRAZOLE  20MG  CAPSULES] 90 capsule 1    Sig: TAKE 1 CAPSULE(20 MG) BY MOUTH DAILY BEFORE BREAKFAST     Gastroenterology: Proton Pump Inhibitors Passed - 05/29/2024  2:30 PM      Passed - Valid encounter within last 12 months    Recent Outpatient Visits           4 months ago Mixed hyperlipidemia   [facility], Marsa PARAS, [place]

## 2024-06-12 ENCOUNTER — Ambulatory Visit: Payer: Medicare Other

## 2024-07-03 ENCOUNTER — Other Ambulatory Visit: Payer: Self-pay | Admitting: Family Medicine

## 2024-07-03 DIAGNOSIS — Z1231 Encounter for screening mammogram for malignant neoplasm of breast: Secondary | ICD-10-CM

## 2024-07-09 DIAGNOSIS — L821 Other seborrheic keratosis: Secondary | ICD-10-CM | POA: Diagnosis not present

## 2024-08-04 ENCOUNTER — Ambulatory Visit
Admission: RE | Admit: 2024-08-04 | Discharge: 2024-08-04 | Disposition: A | Discharge status: Home / Self Care | Source: Ambulatory Visit | Attending: Family Medicine | Admitting: Family Medicine

## 2024-08-04 DIAGNOSIS — Z1231 Encounter for screening mammogram for malignant neoplasm of breast: Secondary | ICD-10-CM | POA: Insufficient documentation

## 2024-09-16 ENCOUNTER — Ambulatory Visit: Payer: Self-pay

## 2024-09-16 NOTE — Telephone Encounter (Signed)
 FYI Only or Action Required?: FYI only for provider: UC advised.  Patient was last seen in primary care on 01/02/2024 by Edman Marsa PARAS, DO.  Called Nurse Triage reporting Dizziness.  Symptoms began several days ago.  Interventions attempted: Rest, hydration, or home remedies and Dietary changes.  Symptoms are: gradually worsening.  Triage Disposition: See Physician Within 24 Hours  Patient/caregiver understands and will follow disposition?: Yes    Moderate dizziness when getting up in the morning x2 days. Lasts about 4 hours. Able to walk but needing to stabilize self using the walls. Denies feeling like passing out, numbness, tingling or weakness. No changes to speech or vision or SOB. Denies CP or palpations. Mild nausea without emesis x2 days, mild headache yesterday. Not diabetic, did test BG this morning using her husband glucometer after eating a cookie and BG was 176. No blood in stool. Not dehydrated. Denies fever. No changes to medications. No appts in next 24 hours, advised UC today, agreeable to go. Advised  ED for worsening symptoms.    Copied from CRM #[id number]. Topic: Clinical - Red Word Triage >> Sep 16, 2024  9:39 AM Terri G wrote: Red Word that prompted transfer to Nurse Triage: experiencing dizziness in the morning after she wakes up for the last 2 days. Reason for Disposition  [1] MODERATE dizziness (e.g., interferes with normal activities) AND [2] has NOT been evaluated by doctor (or NP/PA) for this  (Exception: Dizziness caused by heat exposure, sudden standing, or poor fluid intake.)  Answer Assessment - Initial Assessment Questions 1. DESCRIPTION: Describe your dizziness.     Lightheaded   2. LIGHTHEADED: Do you feel lightheaded? (e.g., somewhat faint, woozy, weak upon standing)     Yes  3. VERTIGO: Do you feel like either you or the room is spinning or tilting? (i.e., vertigo)     Denies  4. SEVERITY: How bad is it?  Do you feel like  you are going to faint? Can you stand and walk?     Moderate. Able to walk. Has to use walls to stabilize self in the morning. Denies feeling like she will pass out.  5. ONSET:  When did the dizziness begin?     2 days ago  6. AGGRAVATING FACTORS: Does anything make it worse? (e.g., standing, change in head position)     Standing up and walking. Occurs in the morning.  7. HEART RATE: Can you tell me your heart rate? How many beats in 15 seconds?  (Note: Not all patients can do this.)       Reports it is normal, unsure exact rate currently.  8. CAUSE: What do you think is causing the dizziness? (e.g., decreased fluids or food, diarrhea, emotional distress, heat exposure, new medicine, sudden standing, vomiting; unknown)     Unsure  9. RECURRENT SYMPTOM: Have you had dizziness before? If Yes, ask: When was the last time? What happened that time?     Denies   10. OTHER SYMPTOMS: Do you have any other symptoms? (e.g., fever, chest pain, vomiting, diarrhea, bleeding)       Mild nausea, mild headache yesterday  Protocols used: Dizziness - Lightheadedness-A-AH

## 2024-10-07 ENCOUNTER — Ambulatory Visit (INDEPENDENT_AMBULATORY_CARE_PROVIDER_SITE_OTHER): Admitting: Family Medicine

## 2024-10-07 ENCOUNTER — Encounter: Payer: Self-pay | Admitting: Family Medicine

## 2024-10-07 VITALS — BP 122/80 | HR 74 | Ht 62.0 in | Wt 133.0 lb

## 2024-10-07 DIAGNOSIS — H8110 Benign paroxysmal vertigo, unspecified ear: Secondary | ICD-10-CM | POA: Diagnosis not present

## 2024-10-07 NOTE — Patient Instructions (Addendum)
 Thank you for coming to the office today.  1. You have symptoms of Vertigo (Benign Paroxysmal Positional Vertigo) - This is commonly caused by inner ear fluid imbalance, sometimes can be worsened by allergies and sinus symptoms, otherwise it can occur randomly sometimes and we may never discover the exact cause. - To treat this, try the Epley Manuever (see diagrams/instructions below) at home up to 3 times a day for 1-2 weeks or until symptoms resolve - You may take Meclizine as needed up to 3 times a day for dizziness, this will not cure symptoms but may help. Caution may make you drowsy.  If you develop significant worsening episode with vertigo that does not improve and you get severe headache, loss of vision, arm or leg weakness, slurred speech, or other concerning symptoms please seek immediate medical attention at Emergency Department.  Call back to consider Referral to Vestibular Rehab if need additional help if it is not improving.  See the next page for images describing the Epley Manuever.     ----------------------------------------------------------------------------------------------------------------------         Please schedule a Follow-up Appointment to: Return if symptoms worsen or fail to improve.  If you have any other questions or concerns, please feel free to call the office or send a message through MyChart. You may also schedule an earlier appointment if necessary.  Additionally, you may be receiving a survey about your experience at our office within a few days to 1 week by e-mail or mail. We value your feedback.  Marsa Officer, DO [facility] [place], [place]

## 2024-10-07 NOTE — Progress Notes (Signed)
 "  Subjective:    Patient ID: Monica Chapman, female    DOB: May 26, 1951, 74 y.o.   MRN: [id number]  Monica Chapman is a 74 y.o. female presenting on 10/07/2024 for Medical Management of Chronic Issues   HPI  Discussed the use of AI scribe software for clinical note transcription with the patient, who gave verbal consent to proceed.  History of Present Illness   Monica Chapman is a 74 year old female who presents with dizziness and vertigo.  Dizziness and vertigo - Onset since the day before Christmas Eve - Described as a sensation of needing to 'hold the walls' - Initial episode lasted approximately five hours and persisted the following day - Worsens with head movements, especially when putting head back - Avoids quick movements due to symptom exacerbation - Dizziness 'came out of the blue' - Familiar with the term vertigo, specifically benign paroxysmal positional vertigo (BPPV)  Evaluation and management - Visited urgent care for evaluation of possible transient ischemic attack; no imaging or further testing performed - Prescribed meclizine; initially took three times daily, then reduced to once in the morning and once at night - Not taking meclizine regularly; took one dose the previous day due to symptoms - Has not attempted any positional exercises for vertigo - Considers the size of her king-size bed when thinking about performing exercises at home  Associated symptoms - No cough, cold, congestion, sinus issues, or ear pain - Mild ear congestion attributed to allergies         01/02/2024   10:10 AM 06/06/2023    3:34 PM 11/01/2022   10:06 AM  Depression screen PHQ 2/9  Decreased Interest 0 0 0  Down, Depressed, Hopeless 0 0 0  PHQ - 2 Score 0 0 0  Altered sleeping 0 0 0  Tired, decreased energy 0 0 0  Change in appetite 0 0 0  Feeling bad or failure about yourself  0 0 0  Trouble concentrating 0 0 0  Moving slowly or fidgety/restless 0 0 0  Suicidal thoughts 0  0 0  PHQ-9 Score 0  0  0   Difficult doing work/chores  Not difficult at all Not difficult at all     Data saved with a previous flowsheet row definition       01/02/2024   10:10 AM 11/01/2022   10:06 AM 06/06/2021    2:52 PM 02/20/2019   10:01 AM  GAD 7 : Generalized Anxiety Score  Nervous, Anxious, on Edge 0 0 0 1  Control/stop worrying 0 0 0 0  Worry too much - different things 0 0 0 0  Trouble relaxing 0 0 0 0  Restless 0 0 0 0  Easily annoyed or irritable 0 0 0 0  Afraid - awful might happen 0 0 0 0  Total GAD 7 Score 0 0 0 1  Anxiety Difficulty  Not difficult at all Not difficult at all Not difficult at all    Social History[1]  Review of Systems Per HPI unless specifically indicated above     Objective:    BP 122/80 (BP Location: Left Arm, Patient Position: Sitting, Cuff Size: Normal)   Pulse 74   Ht 5' 2 (1.575 m)   Wt 133 lb (60.3 kg)   SpO2 99%   BMI 24.33 kg/m   Wt Readings from Last 3 Encounters:  10/07/24 133 lb (60.3 kg)  01/02/24 134 lb 8 oz (61 kg)  08/12/23 126 lb (57.2  kg)    Physical Exam Vitals and nursing note reviewed.  Constitutional:      General: She is not in acute distress.    Appearance: Normal appearance. She is well-developed. She is not diaphoretic.     Comments: Well-appearing, comfortable, cooperative  HENT:     Head: Normocephalic and atraumatic.  Eyes:     General:        Right eye: No discharge.        Left eye: No discharge.     Conjunctiva/sclera: Conjunctivae normal.  Cardiovascular:     Rate and Rhythm: Normal rate.  Pulmonary:     Effort: Pulmonary effort is normal.  Skin:    General: Skin is warm and dry.     Findings: No erythema or rash.  Neurological:     Mental Status: She is alert and oriented to person, place, and time.  Psychiatric:        Mood and Affect: Mood normal.        Behavior: Behavior normal.        Thought Content: Thought content normal.     Comments: Well groomed, good eye contact, normal  speech and thoughts     Results for orders placed or performed during the hospital encounter of 08/12/23  Surgical pathology   Collection Time: 08/12/23 12:00 AM  Result Value Ref Range   SURGICAL PATHOLOGY      SURGICAL PATHOLOGY [facility] [address], [address] [place], [place] [address] Telephone [phone] or [phone] Fax [phone]  REPORT OF SURGICAL PATHOLOGY   Accession #: [id number] Patient Name: Monica Chapman, Monica Chapman Visit # : [id number]  MRN: [id number] Physician: Therisa Bi DOB/Age 09-26-1950 (Age: 18) Gender: F Collected Date: 08/12/2023 Received Date: 08/12/2023  FINAL DIAGNOSIS       1. Cecum Polyp, CBX :       -  TUBULAR ADENOMA, TWO FRAGMENTS.NEGATIVE FOR HIGH GRADE DYSPLASIA OR      MALIGNANCY.      -  COLONIC MUCOSA, TWO FRAGMENTS.  NEGATIVE FOR DYSPLASIA OR MALIGNANCY.       2. Transverse Colon Polyp, CBX :       -  TUBULAR ADENOMA, ONE FRAGMENT.NEGATIVE FOR HIGH GRADE DYSPLASIA OR      MALIGNANCY.      -  COLONIC MUCOSA, ONE FRAGMENT.  NEGATIVE FOR DYSPLASIA OR MALIGNANCY.       3. Sigmoid  Colon Polyp, CBX x1 Cold snare x4 :       - HYPERPLASTIC POLYP, FOUR FRAGMENTS.NEGATIVE FOR DYSPLASIA OR MALIGNANCY.      -  COLONIC MUCOSA, ONE FRAGMENT.  NEGATIVE FOR DYSPLASIA OR MALIGNANCY.       ELECTRONIC SIGNATURE : Jordan Md, [employer] Officer, Sports Administrator, International Aid/development Worker  MICROSCOPIC DESCRIPTION  CASE COMMENTS STAINS USED IN DIAGNOSIS: H&E H&E H&E    CLINICAL HISTORY  SPECIMEN(S) OBTAINED 1. Cecum Polyp, CBX 2. Transverse Colon Polyp, CBX 3. Sigmoid  Colon Polyp, CBX X1 Cold Snare X4  SPECIMEN COMMENTS: SPECIMEN CLINICAL INFORMATION: 1. Personal history of colon polyps.Colon polyps    Gross Description 1. Received in formalin, labeled cbx cecal colon polyp, is a 0.5 x 0.4 x 0.2 cm aggregate of three soft, tan tissue fragments submitted entirely in block 1A. 2. Received in formalin, labeled cbx  transverse colon polyp, is a 0.4 x 0.4 x 0.2 cm aggregate of two soft, tan-gray tissue fragments submitted entirely in block 2A. 3. Received in formalin, labeled cbx sigmoid colon polyp x 1  cold snare colon polyps x 4, is a 1.0 x 1.0 x 0.3 cm aggregate of multiple tan pol ypoid tissue fragments submitted entirely in block 3A.(SB:gt, 08/13/23)        Report signed out from the following location(s) [place] [address] [place], [place] [address] CLIA #: [id number]  [facility] [address] [place], [place] [address] CLIA #: [id number]       Assessment & Plan:   Problem List Items Addressed This Visit   None Visit Diagnoses       Benign paroxysmal positional vertigo, unspecified laterality    -  Primary        Benign paroxysmal positional vertigo Acute dizziness and vertigo likely due to BPPV, triggered by head movements. No stroke symptoms. Meclizine provided relief. BPPV is benign and self-limiting. - Provided printed instructions for Epley's maneuver. - Advised Epley's maneuver three times daily until symptoms resolve. - Instructed to test for affected side by performing Epley's maneuver on both sides. - Offered referral to therapist Lauran Selinda Eck PT if home exercises ineffective. - Advised to call if symptoms persist or worsen.        No orders of the defined types were placed in this encounter.   No orders of the defined types were placed in this encounter.   Follow up plan: Return if symptoms worsen or fail to improve.  Marsa Officer, DO [facility] 10/07/2024, 9:58 AM     [1]  Social History Tobacco Use   Smoking status: Every Day    Current packs/day: 0.25    Types: Cigarettes   Smokeless tobacco: Never   Tobacco comments:    quit in 2017, restarted.   Vaping Use   Vaping status: Never Used  Substance Use Topics   Alcohol use: Yes     Alcohol/week: 21.0 standard drinks of alcohol    Types: 21 Glasses of wine per week    Comment: 3 glasses wine daily   Drug use: Yes    Types: Marijuana    Comment: occasional    "
